# Patient Record
Sex: Female | Born: 1935 | Race: Asian | Hispanic: No | Smoking: Never smoker
Health system: Southern US, Community
[De-identification: ages and names within clinical notes are randomized; demographics above are authoritative.]

## PROBLEM LIST (undated history)

## (undated) DIAGNOSIS — F329 Major depressive disorder, single episode, unspecified: Secondary | ICD-10-CM

## (undated) DIAGNOSIS — R269 Unspecified abnormalities of gait and mobility: Secondary | ICD-10-CM

## (undated) DIAGNOSIS — E119 Type 2 diabetes mellitus without complications: Secondary | ICD-10-CM

## (undated) DIAGNOSIS — G2581 Restless legs syndrome: Secondary | ICD-10-CM

## (undated) DIAGNOSIS — I10 Essential (primary) hypertension: Secondary | ICD-10-CM

## (undated) DIAGNOSIS — F419 Anxiety disorder, unspecified: Secondary | ICD-10-CM

## (undated) DIAGNOSIS — G5710 Meralgia paresthetica, unspecified lower limb: Secondary | ICD-10-CM

## (undated) DIAGNOSIS — I679 Cerebrovascular disease, unspecified: Secondary | ICD-10-CM

## (undated) DIAGNOSIS — R1314 Dysphagia, pharyngoesophageal phase: Secondary | ICD-10-CM

## (undated) DIAGNOSIS — G4752 REM sleep behavior disorder: Secondary | ICD-10-CM

## (undated) DIAGNOSIS — G259 Extrapyramidal and movement disorder, unspecified: Secondary | ICD-10-CM

## (undated) DIAGNOSIS — F32A Depression, unspecified: Secondary | ICD-10-CM

## (undated) HISTORY — DX: Restless legs syndrome: G25.81

## (undated) HISTORY — DX: Type 2 diabetes mellitus without complications: E11.9

## (undated) HISTORY — DX: Extrapyramidal and movement disorder, unspecified: G25.9

## (undated) HISTORY — DX: Meralgia paresthetica, unspecified lower limb: G57.10

## (undated) HISTORY — DX: Dysphagia, pharyngoesophageal phase: R13.14

## (undated) HISTORY — DX: Essential (primary) hypertension: I10

## (undated) HISTORY — DX: Anxiety disorder, unspecified: F41.9

## (undated) HISTORY — PX: OTHER SURGICAL HISTORY: SHX169

## (undated) HISTORY — DX: Cerebrovascular disease, unspecified: I67.9

## (undated) HISTORY — DX: Unspecified abnormalities of gait and mobility: R26.9

## (undated) HISTORY — DX: Major depressive disorder, single episode, unspecified: F32.9

## (undated) HISTORY — DX: REM sleep behavior disorder: G47.52

## (undated) HISTORY — DX: Depression, unspecified: F32.A

---

## 2004-10-12 ENCOUNTER — Encounter: Admission: RE | Admit: 2004-10-12 | Discharge: 2004-10-12 | Payer: Self-pay | Admitting: Cardiovascular Disease

## 2006-01-09 ENCOUNTER — Inpatient Hospital Stay (HOSPITAL_COMMUNITY): Admission: EM | Admit: 2006-01-09 | Discharge: 2006-01-17 | Payer: Self-pay | Admitting: Emergency Medicine

## 2006-01-10 ENCOUNTER — Ambulatory Visit: Payer: Self-pay | Admitting: Infectious Diseases

## 2006-03-21 ENCOUNTER — Encounter: Admission: RE | Admit: 2006-03-21 | Discharge: 2006-03-21 | Payer: Self-pay | Admitting: Cardiovascular Disease

## 2007-07-18 ENCOUNTER — Inpatient Hospital Stay (HOSPITAL_COMMUNITY): Admission: AD | Admit: 2007-07-18 | Discharge: 2007-07-20 | Payer: Self-pay | Admitting: Cardiovascular Disease

## 2007-07-19 ENCOUNTER — Encounter (INDEPENDENT_AMBULATORY_CARE_PROVIDER_SITE_OTHER): Payer: Self-pay | Admitting: Gastroenterology

## 2007-07-20 ENCOUNTER — Encounter (INDEPENDENT_AMBULATORY_CARE_PROVIDER_SITE_OTHER): Payer: Self-pay | Admitting: Cardiovascular Disease

## 2010-05-25 ENCOUNTER — Other Ambulatory Visit: Payer: Self-pay | Admitting: Family Medicine

## 2010-05-25 DIAGNOSIS — G20 Parkinson's disease: Secondary | ICD-10-CM

## 2010-05-28 ENCOUNTER — Ambulatory Visit
Admission: RE | Admit: 2010-05-28 | Discharge: 2010-05-28 | Disposition: A | Discharge status: Home / Self Care | Payer: Medicare Other | Source: Ambulatory Visit | Attending: Family Medicine | Admitting: Family Medicine

## 2010-05-28 DIAGNOSIS — G20 Parkinson's disease: Secondary | ICD-10-CM

## 2010-05-28 MED ORDER — GADOBENATE DIMEGLUMINE 529 MG/ML IV SOLN: 11.0000 mL | Freq: Once | INTRAVENOUS | Status: AC | PRN

## 2010-09-01 NOTE — H&P (Signed)
[name], [name] NO.:  [id number]   MEDICAL RECORD NO.:  [id number]          PATIENT TYPE:  INP   LOCATION:  2011                         FACILITY:  [facility]   PHYSICIAN:  Ricki Rodriguez, M.D.  DATE OF BIRTH:  1929-05-04   DATE OF ADMISSION:  07/18/2007  DATE OF DISCHARGE:                              HISTORY & PHYSICAL   CHIEF COMPLAINT:  Weakness.   HISTORY OF PRESENT ILLNESS:  This 77 year old [place] female has  history of weakness for the last 1 week without any abdominal pain,  nausea, vomiting or bleeding per rectum   PAST MEDICAL HISTORY:  1. Diabetes mellitus for 2 years.  2. Hypertension for 5-10 years.  3. Hyperlipidemia.  4. Obesity.  5. Negative history of smoking, alcohol use or drug use.  6. Negative history of myocardial infarction or exercise.   PERSONAL HISTORY:  Patient is a widow; husband died in 23; he died of  overwork and starvation.   PAST SURGICAL HISTORY:  None.   MEDICATIONS:  1. Lexapro 10 mg one daily.  2. Caduet 5/10 half a tablet daily.  3. Multivitamin 1 daily.  4. Glucosamine/chondroitin sulfate 1 tablet daily.  5. Benadryl 25 mg half a tablet daily.  6. Metoprolol 50 mg half a tablet daily.  7. Calcium 600 mg daily.  8. Glimepiride 2 mg, one-fourth tablet daily.  9. Lorazepam 1 mg half a tablet daily.   ALLERGIES:  None.   FAMILY HISTORY:  Mother died of old age.  Father died of old age.  The  patient has 7 brothers; 5 of them have died of old age; 2 are living.  The patient does not have any sisters.   REVIEW OF SYSTEMS:  The patient admits to weight gain and vision change,  does not wear glasses.  No history of cataract surgery.  No history of  hearing loss.  No history of tinnitus.  Does not have any dentures.  No  asthma, COPD or pneumonia.  Positive history of exertional dyspnea.  Negative history of palpitations, dizziness, chest pain, leg edema,  claudication, nausea, vomiting, diarrhea, GI  bleed, ulcer, hiatal  hernia, hepatitis, blood transfusion, kidney stones, strokes, seizures  and psychiatric admissions.  Positive history of constipation and joint  pains.   PHYSICAL EXAMINATION:  VITAL SIGNS:  Pulse 93, respirations 20, blood  pressure 152/70, temperature 96.6, oxygen saturation 100% on room air.  The patient is 5-feet tall and weighs approximately 170 pounds.  GENERAL:  The patient is alert, oriented x2.  HEENT: The patient is normocephalic, atraumatic, has brown eyes and hazy  lens.  Pupils are equally reacting to light.  Teeth are stained.  Tongue  is pale.  Conjunctivae pale.  NECK:  No JVD, no carotid bruit.  LUNGS:  Clear bilaterally.  HEART:  Normal S1-S2 with grade 2/6 systolic murmur.  ABDOMEN:  Soft, distended, but nontender.  EXTREMITIES:  Trace edema.  CNS: Cranial nerves grossly intact.  The patient moves all 4  extremities, has bilateral equal grips, has fine tremors of the hands  and has  a shuffling gait.   LABORATORY DATA:  Revealed hemoglobin of 6.1, hematocrit of 18.3, WBC  count 8900 and platelet count 294,000.  The patient's blood group is B  positive.  Cholesterol 118, LDL cholesterol of 50, HDL cholesterol of 43  with triglyceride 127.   CMET pending.   IMPRESSION:  1. Anemia of blood loss.  2. Possible iron-deficiency anemia.  3. Obesity.  4. Hyperlipidemia.  5. Diabetes mellitus type 2.  6. Hypertension.   PLAN:  Plan is to transfuse typed and crossmatched blood, get GI consult  in the morning.      Ricki Rodriguez, M.D.  Electronically Signed     ASK/MEDQ  D:  07/18/2007  T:  07/19/2007  Job:  [id number]

## 2010-09-01 NOTE — Consult Note (Signed)
NAMEKERSTON, LANDECK NO.:  [id number]   MEDICAL RECORD NO.:  [id number]          PATIENT TYPE:  INP   LOCATION:  2011                         FACILITY:  [facility]   PHYSICIAN:  Petra Kuba, M.D.    DATE OF BIRTH:  1930-01-07   DATE OF CONSULTATION:  07/19/2007  DATE OF DISCHARGE:                                 CONSULTATION   REASON FOR CONSULTATION:  We were asked to see [name] for anemia by  Dr. Orpah Cobb.   HISTORY OF PRESENT ILLNESS:  This is a 77 year old [place] female who  is non-English speaking, __________ history was provided by Dr. Algie Coffer  and by the patient's son.  Dr. Algie Coffer tells me that the patient's  hemoglobin in his office several days ago was 6.5.  She denied any black  stool, any bright red blood per rectum.  She was admitted with fatigue  and weakness, transfused two units of packed red blood cells.  Per her  son, his mother has had difficulty sleeping and has become very fatigued  over the last three weeks.  He reports that she has had no postprandial  pain, no abdominal pain, no vomiting or other upper GI tract symptoms.  She denies that she has been taking NSAIDs.  He says she uses Tylenol  occasionally for pain; however, on my exam the patient is Guaiac  positive with black stool.   PAST MEDICAL HISTORY:  Significant for hypertension, hyperlipidemia,  Parkinson's disease, obesity, type 2 diabetes, chronic back pain,  chronic anxiety, bilateral lower extremity cellulitis.  Reportedly, she  has no history of GI bleeding before.   CURRENT MEDICATIONS:  1. Lexapro.  2. Caduet.  3. Multivitamins.  4. Glucosamine.  5. Benadryl.  6. Metoprolol.  7. Calcium.  8. Glimepiride.  9. Lorazepam.   ALLERGIES:  She has no known drug allergies.   SOCIAL HISTORY:  Social history is negative for alcohol, tobacco or  drugs.   FAMILY HISTORY:  Negative for colon cancer.  Positive for H. Pylori in  the son.   REVIEW OF SYSTEMS:  Is  positive for fatigue and insomnia   PHYSICAL EXAMINATION:  GENERAL:  She is alert and sitting up.  VITAL SIGNS:  Temperature 97.9, pulse 55, respirations are 18.  Blood  pressure is 125/80.  HEART: Has regular rate and rhythm with no murmurs, rubs or gallops  appreciated.  LUNGS:  Are clear to auscultation anteriorly.  ABDOMEN:  Obese, nontender, nondistended with good bowel sounds.  On  rectal exam she has no masses and no tenderness.  She has small amounts  of black stool that are very Guaiac positive.   LABORATORY DATA:  Labs show a post transfusion hemoglobin of 10.7,  hematocrit 31.0, white count 10.3, platelet count 275,000.  Her BUN is  17, creatinine is 0.84.  TIBC is 326.  O2 sats __________ 9.  Serum iron  is 30.  On chest x-ray she has cardiomegaly.   ASSESSMENT:  The patient has been seem and examined by Dr. Vida Rigger  who has examined the chart  and reviewed her history.  His impression is  that this is a patient with Guaiac positive, iron deficiency anemia  likely with peptic ulcer disease.  Will add Protonix and schedule her  for an upper endoscopy either late today or first thing tomorrow  morning.   Thank you very much for this consultation.      Stephani Police, PA    ______________________________  Petra Kuba, M.D.    MLY/MEDQ  D:  07/19/2007  T:  07/19/2007  Job:  [id number]

## 2010-09-01 NOTE — Op Note (Signed)
[name], RAHIMI NO.:  [id number]   MEDICAL RECORD NO.:  [id number]          PATIENT TYPE:  INP   LOCATION:  2011                         FACILITY:  [facility]   PHYSICIAN:  Petra Kuba, M.D.    DATE OF BIRTH:  June 04, 1929   DATE OF PROCEDURE:  07/19/2007  DATE OF DISCHARGE:                               OPERATIVE REPORT   PROCEDURE PERFORMED:  Esophagogastroduodenoscopy with biopsy.   INDICATION:  Guaiac positive anemia.  Consent was signed after risks,  benefits, methods, and options were thoroughly discussed by both myself  and my PA with the patient's daughter and she had discussed it with the  son before any sedation.   MEDICINES USED:  Fentanyl 50 mcg and Versed 4 mg.   PROCEDURE:  The video endoscope was inserted by direct vision.  She did  have a small hiatal hernia.  The scope was passed into the stomach and  some distal small shallow stomach ulcers were seen.  One had some black  area on it and no signs of active bleeding and was pertinent for some  mild to moderate antritis. The scope was inserted into the duodenal bulb  where a moderate size bulb ulcer was seen, it had some blackish brown  spots on it, multiple washing and suctioning was done, could not be made  to bleed.  The scope was advanced around the C-loop to a normal second  and probably third part of the duodenum except for some mild duodenitis.  No signs of bleeding or distal bleeding were seen.  The scope was slowly  withdrawn back to the bulb and, again, the ulcer seen, was washed and  watched, could not be made to bleed.  The scope was withdrawn back to  the stomach and retroflexed.  The angularis, cardia, fundus, lesser and  greater curve were evaluated on retroflex and straight visualization.  Other than some mild proximal gastritis, no additional findings were  seen. The scope was advanced to the antrum, a few biopsies of the antrum  with small erosions and ulcers as well as the  antritis were taken, and a  few of the proximal stomach were obtained to rule out Helicobacter.  Again, the scope was one more time re-advanced into the duodenum.  No  signs of bleeding were seen.  The scope was then withdrawn back to the  stomach.  Air and water were suctioned, the scope withdrawn. Again, a  good look at the esophagus was normal.  The scope was withdrawn.  The  patient tolerated the procedure well.  There was no obvious immediate  complication.   ENDOSCOPIC DIAGNOSES:  1. Small hiatal hernia.  2. Mild gastritis antritis with a few small stomach and antral ulcers      status post biopsy.  3. Medium size proximal bulb ulcer with brown and black flat spots,      unable to make bleed with washing and suctioning.  4. Otherwise, within normal limits to the third part of the duodenum      except for some mild duodenitis.  PLAN:  Await biopsies.  No aspirin or nonsteroidals. Pump inhibitors.  Consider repeat endoscopy in two months and proceed with her colonoscopy  at that junction, too, as an outpatient. However, might proceed with  colonoscopy sooner if signs of continual bleeding. Will see her back in  the office in a few weeks to decide, as long as we are able to advance  her diet and no signs of further problems in the hospital.           ______________________________  Petra Kuba, M.D.     MEM/MEDQ  D:  07/19/2007  T:  07/19/2007  Job:  [id number]   cc:   Ricki Rodriguez, M.D.

## 2010-09-04 NOTE — H&P (Signed)
[name], [name] NO.:  [id number]   MEDICAL RECORD NO.:  [id number]          PATIENT TYPE:  INP   LOCATION:  1826                         FACILITY:  [facility]   PHYSICIAN:  Ricki Rodriguez, M.D.  DATE OF BIRTH:  1930/03/19   DATE OF ADMISSION:  01/09/2006  DATE OF DISCHARGE:                                HISTORY & PHYSICAL   CHIEF COMPLAINT:  Bilateral lower extremity redness and swelling with  itching.   HISTORY OF PRESENT ILLNESS:  This 77 year old [place] Asian female has a 1-  2 week history of bilateral leg redness, swelling and itching, without  fever.  The patient was recently diagnosed with diabetes.  Has a history of  hypertension.   PAST MEDICAL HISTORY:  1. Diabetes, recent onset.  2. Hypertension for a few years.  3. No history of smoking, alcohol use or drug use.  4. Elevated cholesterol level.  5. Positive history of obesity.  (No history of myocardial infarction.  No history of exercise.)   FAMILY HISTORY:  Premature coronary artery disease.   PAST SURGICAL HISTORY:  None.   ALLERGIES:  None.   PERSONAL HISTORY:  The patient is widowed.  Her husband died in 84.  He  died of overwork and starvation.   FAMILY HISTORY:  Mother died of old age.  Father died of old age.  The  patient 7 brothers; 5 of them died of old age; 2 living.  The patient does  not have any sisters.   REVIEW OF SYSTEMS:  The patient admits to weight gain, vision change.  Does  not wear glasses.  No history of cataract surgery.  No hearing loss.  No  tinnitus.  No dentures.  No asthma or COPD or pneumonias.  Positive history  of dyspnea.  Negative history of palpitations, dizziness, chest pain, leg  edema, claudication, nausea, vomiting, diarrhea, GI bleed, ulcer, hiatal  hernia, hepatitis, blood transfusion, kidney stones, stroke, seizures,  psychiatric admissions.  Positive history of constipation and joint pains.  No history of skin rash in the past.   PHYSICAL EXAMINATION:  VITAL SIGNS:  Pulse 68, respirations p20, blood  pressure 197/97, temperature 97.2.  The patient is 5 feet tall and weighs  approximately 140 pounds.  GENERAL:  She is alert and oriented x3.  HEENT:  The patient is normocephalic and atraumatic.  Has brown eyes, hazy  lens with pupils equal and reactive to light.  Teeth are stained.  NECK:  No JVD, no carotid bruit.  LUNGS:  Clear bilaterally.  HEART:  Normal S1 and S2 with a grade 2/6 systolic murmur.  ABDOMEN:  Soft and nontender.  EXTREMITIES:  Mild tenderness, significant erythema of the right mid thigh  to half of the upper portion of the lower leg including the skin over the  knee joint is erythematous with some peeling of the skin, and left lower leg  larger, approximately 10-12 inch area of irregular-bordered rash with  redness and some peeling of the skin.  The calf area is nontender.  CNS:  Cranial nerves grossly intact.  The patient moves all 4 extremities.  Has bilateral equal grips.   LABORATORY DATA:  Normal hemoglobin and hematocrit.  Elevated WBC count of  14,900, platelet count of 280,000.  Sodium 140, potassium 4.3, chloride 105,  BUN 9, creatinine 0.9.  Glucose elevated at 181.   IMPRESSION:  1. Bilateral lower extremity cellulitis.  2. Possibilities eczematous condition with secondary skin infection.  3. Hypertension.  4. Chronic back pain.   PLAN:  The plan is to continue home medications and add IV clindamycin.  Topical Lotrisone cream.  Start Lantus and sliding scale insulin for  diabetes control. Infectious disease consult in AM.      Ricki Rodriguez, M.D.  Electronically Signed     ASK/MEDQ  D:  01/09/2006  T:  01/11/2006  Job:  [id number]

## 2010-09-04 NOTE — Discharge Summary (Signed)
[name], [name] NO.:  [id number]   MEDICAL RECORD NO.:  [id number]          PATIENT TYPE:  INP   LOCATION:  2011                         FACILITY:  [facility]   PHYSICIAN:  [name], M.D.  DATE OF BIRTH:  [birth year]   DATE OF ADMISSION:  07/18/2007  DATE OF DISCHARGE:  07/20/2007                               DISCHARGE SUMMARY   FINAL DIAGNOSES:  1. Gastroenteritis with nonspecific hemorrhage.  2. Post hemorrhagic anemia.  3. Stomach ulcer.  4. Duodenal ulcer.  5. Chronic blood loss and iron deficiency anemia.  6. Diabetes mellitus.  7. Hypertension.  8. Hyperlipidemia.  9. Obesity.  10.Paralysis agitans.  11.Back ache.  12.Anxiety.   PRINCIPAL PROCEDURE:  Esophagogastroduodenoscopy with closed biopsy by  Dr. Vida Rigger.   DISCHARGE MEDICATIONS:  1. Lexapro 5 mg 1 daily.  2. Caduet 5/20 mg 1/2 daily.  3. Multivitamin 1 daily.  4. Glucosamine/chondroitin sulphate 1 tablet daily.  5. Benadryl 50 mg 1/2 tablet in the a.m. and 1 in p.m.  6. Metoprolol 25 mg 1 in evening.  7. Calcium 600 mg plus vitamin D 400 units 1 daily in the evening.  8. Glimepiride 4 mg 1/4 tablet in the morning.  9. Lorazepam 0.5 mg 1 twice daily.  10.Protonix 40 mg 1 twice daily.   DISCHARGE DIET:  Low-sodium, heart-healthy diet.   DISCHARGE ACTIVITY:  The patient increase activity slowly.   FOLLOWUP:  By Dr. Orpah Cobb in 1-2 weeks and by Dr. Vida Rigger in 2-3  weeks.  The patient to call [phone] and [phone] respectively.   HISTORY:  This 77 year old [place] female had a history of  weakness for 1 week without any GI symptoms.  Her hemoglobin was found  to be low.   PHYSICAL EXAMINATION:  VITALS:  Pulse 93, respiration 20, blood pressure  152/70, temperature 96.6, oxygen saturation 100% on room air.  The  patient was 5 feet tall and weighed approximately 170 pounds.  GENERAL:  The patient was alert and oriented x3.  HEENT:  The patient is  normocephalic and atraumatic, has brown eyes,  wears lens.  Pupils are equal and reacting to light.  Extraocular  movement intact.  Teeth are stained. Tongue pale.  Conjunctivae pale.  NECK:  No JVD.  No carotid bruit.  LUNGS:  Clear bilaterally.  HEART:  Normal S1 and S2 with rate 2/6 systolic murmur.  ABDOMEN: Soft, distended but nontender.  EXTREMITIES:  Trace edema.  CNS:  Cranial nerves grossly intact.  The patient moves all 4  extremities, has bilateral infiltrates, and has fine tremors of the  hands and has a shuffling gait.   LABORATORY DATA:  Revealed hemoglobin 6.1, hematocrit 18, WBC count of  8900, platelets count 294,000, blood group B positive.  Cholesterol 118,  LDL cholesterol 58, HDL cholesterol 43, triglyceride of 127.  Electrolytes normal.  Glucose borderline 140, subsequent glucose 88.  BUN and creatinine normal, albumin slightly low at 3.2.  Iron low at 30.  Percent iron saturation 9.   EKG sinus rhythm with left ventricular hypertrophy.  Echocardiogram showed normal LV function with mild LVH, mild aortic  valve calcification with regurgitation, mild mitral valve regurgitation  and mild tricuspid valve regurgitation.   HOSPITAL COURSE:  The patient was admitted to telemetry unit.  He  received 2 units of blood.  This improved her hemoglobin to 10.7.  She  had a GI consult by Dr. Ewing Schlein.  Endoscopy revealed stomach and duodenal  ulcers which could be a chronic source of GI bleed.  The patient's  overall condition remained stable for next 24 to 48 hours.  Hence on  July 20, 2007, she was discharged home in satisfactory condition with  followup by me and by GI doctor in 2-4 weeks.      [name], M.D.  Electronically Signed     ASK/MEDQ  D:  08/23/2007  T:  08/24/2007  Job:  [id number]

## 2011-01-11 LAB — COMPREHENSIVE METABOLIC PANEL
AST: 24
Albumin: 3.2 — ABNORMAL LOW
Calcium: 9
Creatinine, Ser: 1
GFR calc non Af Amer: 54 — ABNORMAL LOW

## 2011-01-11 LAB — CBC
HCT: 18.3 — ABNORMAL LOW
Hemoglobin: 6.1 — CL
MCHC: 33.4
Platelets: 294
RDW: 14.3

## 2011-01-11 LAB — DIFFERENTIAL
Eosinophils Relative: 2
Lymphocytes Relative: 25
Lymphs Abs: 2.2
Monocytes Relative: 5
Neutrophils Relative %: 67

## 2011-01-11 LAB — IRON AND TIBC
Saturation Ratios: 9 — ABNORMAL LOW
UIBC: 296

## 2011-01-11 LAB — ABO/RH: ABO/RH(D): B POS

## 2011-01-11 LAB — TYPE AND SCREEN
ABO/RH(D): B POS
Antibody Screen: NEGATIVE

## 2011-01-11 LAB — LIPID PANEL
LDL Cholesterol: 50
Triglycerides: 127

## 2011-01-11 LAB — PREPARE RBC (CROSSMATCH)

## 2011-01-12 LAB — BASIC METABOLIC PANEL
BUN: 16
CO2: 24
Calcium: 9.1
Chloride: 109
Chloride: 110
Creatinine, Ser: 0.84
GFR calc Af Amer: >60
GFR calc non Af Amer: >60
Sodium: 141
Sodium: 141

## 2011-01-12 LAB — DIFFERENTIAL
Lymphocytes Relative: 26
Lymphs Abs: 2.7
Neutrophils Relative %: 63

## 2011-01-12 LAB — CBC
HCT: 31 — ABNORMAL LOW
MCHC: 34.5
MCV: 90.1
RDW: 14.6

## 2012-07-10 ENCOUNTER — Encounter: Payer: Self-pay | Admitting: Neurology

## 2012-07-10 ENCOUNTER — Ambulatory Visit (INDEPENDENT_AMBULATORY_CARE_PROVIDER_SITE_OTHER): Payer: Medicare Other | Admitting: Neurology

## 2012-07-10 VITALS — BP 98/62 | HR 60 | Ht <= 58 in | Wt 132.0 lb

## 2012-07-10 DIAGNOSIS — G5710 Meralgia paresthetica, unspecified lower limb: Secondary | ICD-10-CM | POA: Insufficient documentation

## 2012-07-10 DIAGNOSIS — G20 Parkinson's disease: Secondary | ICD-10-CM

## 2012-07-10 DIAGNOSIS — G4752 REM sleep behavior disorder: Secondary | ICD-10-CM | POA: Insufficient documentation

## 2012-07-10 DIAGNOSIS — R269 Unspecified abnormalities of gait and mobility: Secondary | ICD-10-CM

## 2012-07-10 NOTE — Progress Notes (Signed)
   Reason for visit: Parkinson's disease  [name] is an 79 y.o. female  History of present illness:  [name] is and 79 year old right-handed [place] female with a history of Parkinson's disease. The patient was increased on the Stalevo taking 4 tablets daily, but she did less well on this dose. The patient was cut back to taking 3 tablets daily, and she has function better. The patient still freezes on occasion, but she does not fall. There are no reports of memory issues or confusion or hallucinations. The patient is eating and swallowing well, she has not had any problems with choking or drooling. The patient is sleeping well at night. The patient returns for an evaluation. The family is satisfied with her functional abilities. The patient uses a quad cane for ambulation.  ROS:  Out of a complete 14 system review of symptoms, the patient complains only of the following symptoms, and all other reviewed systems are negative.  Gait disorder Tremor  Blood pressure 98/62, pulse 60, height 4' 9.5" (1.461 m), weight 132 lb (59.875 kg).  Physical Exam  General: The patient is alert and cooperative at the time of the examination. Prominent masking of the face is noted.  Skin: No significant peripheral edema is noted.   Neurologic Exam  Cranial nerves: Facial symmetry is present. Speech is normal, no aphasia or dysarthria is noted. Extraocular movements are notable for significant restriction of superior gaze. Horizontal gaze is well-maintained. Visual fields are full.  Motor: The patient has good strength in all 4 extremities.  Coordination: The patient has good finger-nose-finger and heel-to-shin bilaterally.  Gait and station: The patient has a a slow shuffling gait with decrease arm swing. The patient uses a quad cane for ambulation. Tandem gait was not attempted. Romberg is negative. No drift is seen.  Reflexes: Deep tendon reflexes are  symmetric.   Assessment/Plan:  1. Parkinson's disease  2. Gait disorder  The patient is functioning relatively well. The patient still has significant bradykinesia, but she also has mild dyskinesias on examination. The medication dosing will be kept the same. The family will contact me if there are issues in the future. The patient will otherwise followup in 5 or 6 months.  Marlan Palau MD 07/10/2012 11:48 AM

## 2012-07-10 NOTE — Patient Instructions (Addendum)
Parkinson's Disease Parkinson's disease is a disorder of the central nervous system, which includes the brain and spinal cord. A person with this disease slowly loses the ability to completely control body movements. Within the brain, there is a group of nerve cells (basal ganglia) that help control movement. The basal ganglia are damaged and do not work properly in a person with Parkinson's disease. In addition, the basal ganglia produce and use a brain chemical called dopamine. The dopamine chemical sends messages to other parts of the body to control and coordinate body movements. Dopamine levels are low in a person with Parkinson's disease. If the dopamine levels are low, then the body does not receive the correct messages it needs to move normally.  CAUSES  The exact reason why the basal ganglia get damaged is not known. Some medical researchers have thought that infection, genes, environment, and certain medicines may contribute to the cause.  SYMPTOMS   An early symptom of Parkinson's disease is often an uncontrolled shaking (tremor) of the hands. The tremor will often disappear when the affected hand is consciously used.  As the disease progresses, walking, talking, getting out of a chair, and new movements become more difficult.  Muscles get stiff and movements become slower.  Balance and coordination become harder.  Depression, trouble swallowing, urinary problems, constipation, and sleep problems can occur.  Later in the disease, memory and thought processes may deteriorate. DIAGNOSIS  There are no specific tests to diagnose Parkinson's disease. You may be referred to a neurologist for evaluation. Your caregiver will ask about your medical history, symptoms, and perform a physical exam. Blood tests and imaging tests of your brain may be performed to rule out other diseases. The imaging tests may include an MRI or a CT scan. TREATMENT  The goal of treatment is to relieve symptoms.  Medicines may be prescribed once the symptoms become troublesome. Medicine will not stop the progression of the disease, but medicine can make movement and balance better and help control tremors. Speech and occupational therapy may also be prescribed. Sometimes, surgical treatment of the brain can be done in young people. HOME CARE INSTRUCTIONS  Get regular exercise and rest periods during the day to help prevent exhaustion and depression.  If getting dressed becomes difficult, replace buttons and zippers with Velcro and elastic on your clothing.  Take all medicine as directed by your caregiver.  Install grab bars or railings in your home to prevent falls.  Go to speech or occupational therapy as directed.  Keep all follow-up visits as directed by your caregiver. SEEK MEDICAL CARE IF:  Your symptoms are not controlled with your medicine.  You fall.  You have trouble swallowing or choke on your food. MAKE SURE YOU:  Understand these instructions.  Will watch your condition.  Will get help right away if you are not doing well or get worse. Document Released: 04/02/2000 Document Revised: 10/05/2011 Document Reviewed: 05/05/2011 [employer] Patient Information 2013 [employer].  

## 2012-10-21 ENCOUNTER — Other Ambulatory Visit: Payer: Self-pay | Admitting: Neurology

## 2012-12-01 ENCOUNTER — Other Ambulatory Visit: Payer: Self-pay | Admitting: Neurology

## 2013-01-08 ENCOUNTER — Ambulatory Visit (INDEPENDENT_AMBULATORY_CARE_PROVIDER_SITE_OTHER): Payer: Medicare Other | Admitting: Neurology

## 2013-01-08 ENCOUNTER — Encounter: Payer: Self-pay | Admitting: Neurology

## 2013-01-08 VITALS — BP 113/65 | HR 73 | Wt 133.0 lb

## 2013-01-08 DIAGNOSIS — R269 Unspecified abnormalities of gait and mobility: Secondary | ICD-10-CM

## 2013-01-08 DIAGNOSIS — G20 Parkinson's disease: Secondary | ICD-10-CM

## 2013-01-08 MED ORDER — SELEGILINE HCL 5 MG PO TABS: ORAL_TABLET | ORAL | Status: DC

## 2013-01-08 NOTE — Patient Instructions (Signed)
We will continue the stalevo without change in the dosing. Start Selegiline 5 mg in the morning, and do this for 4 weeks, then go to one tablet in the morning and one tablet at noon.

## 2013-01-08 NOTE — Progress Notes (Signed)
Reason for visit: Parkinson's disease  [name] is an 79 y.o. female  History of present illness:  Ms. Gambrell is an 79 year old right-handed [place] female with a history of Parkinson's disease. The patient is on Stalevo taking the 50/200/200 mg tablet 3 times daily. The patient has some dyskinesias on this dose. The patient was unable to tolerate higher doses. The patient takes Cogentin 0.5 mg 3 times daily for tremors. The patient has had some difficulty with walking, occasionally using a cane, but generally she requires assistance for walking. The patient has not had any falls. The family indicates that she walks very little in the house. The patient has not had any problems with choking or problems with swallowing. The patient does not have confusion or hallucinations. The patient sleeps well at night. The patient will have intermittent tremors, and she is also having problems with freezing with walking. The patient returns for an evaluation. The patient requires assistance with bathing, dressing, and occasionally with feeding. The family does not report any memory issues.  Past Medical History  Diagnosis Date  . Gait disorder   . Diabetes mellitus without complication   . Hypertension   . Anxiety   . Depression   . Restless leg syndrome   . REM sleep behavior disorder   . Cerebrovascular disease   . Meralgia paresthetica     Bilateral  . Movement disorder     Parkinson's disease    History reviewed. No pertinent past surgical history.  History reviewed. No pertinent family history.  Social history:  reports that she has never smoked. She does not have any smokeless tobacco history on file. She reports that she does not drink alcohol or use illicit drugs.   No Known Allergies  Medications:  Current Outpatient Prescriptions on File Prior to Visit  Medication Sig Dispense Refill  . benztropine (COGENTIN) 0.5 MG tablet TAKE 1 TABLET BY MOUTH THREE TIMES DAILY  90 tablet   3  . carbidopa-levodopa-entacapone (STALEVO) 50-200-200 MG per tablet TAKE 1 TABLET BY MOUTH THREE TIMES DAILY  90 tablet  3  . chlorthalidone (HYGROTON) 50 MG tablet Take 50 mg by mouth daily.      . Multiple Vitamin (MULTIVITAMIN) tablet Take 1 tablet by mouth daily.       No current facility-administered medications on file prior to visit.    ROS:  Out of a complete 14 system review of symptoms, the patient complains only of the following symptoms, and all other reviewed systems are negative.  Weakness Slurred speech Gait disorder Tremors  Blood pressure 113/65, pulse 73, weight 133 lb (60.328 kg).  Physical Exam  General: The patient is alert and cooperative at the time of the examination. Masking of the face is seen.  Skin: No significant peripheral edema is noted.   Neurologic Exam  Cranial nerves: Facial symmetry is present. Speech is normal, no aphasia or dysarthria is noted. Extraocular movements are full. Visual fields are full.  Motor: The patient has good strength in all 4 extremities.  Coordination: The patient has good finger-nose-finger and heel-to-shin bilaterally. The patient has some dyskinesias involving the legs and body.  Gait and station: The patient requires assistance with walking. The patient requires assistance with standing. The patient will have freezing with initiation of walking, and with turns. Romberg is negative. No drift is seen.  Reflexes: Deep tendon reflexes are symmetric.   Assessment/Plan:  1. Parkinson's disease  2. Gait disorder  The patient is having some  problems with freezing with turns, initiating walking, and going through door frames. The patient will be placed on selegiline at this time. The patient will followup in 4 months. The Stalevo dose will be unchanged. I have encouraged the family to walk her on a regular basis.  Marlan Palau MD 01/08/2013 9:41 PM  [facility] [address] [place], [place] [address]  Phone [phone] Fax [phone]

## 2013-02-24 ENCOUNTER — Other Ambulatory Visit: Payer: Self-pay | Admitting: Neurology

## 2013-03-01 ENCOUNTER — Other Ambulatory Visit: Payer: Self-pay | Admitting: Neurology

## 2013-04-06 ENCOUNTER — Other Ambulatory Visit: Payer: Self-pay | Admitting: Neurology

## 2013-07-23 ENCOUNTER — Encounter (INDEPENDENT_AMBULATORY_CARE_PROVIDER_SITE_OTHER): Payer: Self-pay

## 2013-07-23 ENCOUNTER — Encounter: Payer: Self-pay | Admitting: Neurology

## 2013-07-23 ENCOUNTER — Ambulatory Visit (INDEPENDENT_AMBULATORY_CARE_PROVIDER_SITE_OTHER): Payer: Medicare Other | Admitting: Neurology

## 2013-07-23 VITALS — BP 155/78 | HR 72 | Ht <= 58 in | Wt 142.0 lb

## 2013-07-23 DIAGNOSIS — G20A1 Parkinson's disease without dyskinesia, without mention of fluctuations: Secondary | ICD-10-CM

## 2013-07-23 DIAGNOSIS — G20 Parkinson's disease: Secondary | ICD-10-CM

## 2013-07-23 DIAGNOSIS — R269 Unspecified abnormalities of gait and mobility: Secondary | ICD-10-CM

## 2013-07-23 MED ORDER — BENZTROPINE MESYLATE 0.5 MG PO TABS: 0.5000 mg | ORAL_TABLET | Freq: Three times a day (TID) | ORAL | Status: DC

## 2013-07-23 NOTE — Patient Instructions (Signed)

## 2013-07-23 NOTE — Progress Notes (Signed)
Reason for visit: Parkinson's disease  [name] is an 80 y.o. female  History of present illness:  [name] is an 80 year old right-handed [place] female with a history of Parkinson's disease. The patient has been on Stalevo taking the 50/200/200 mg tablet, one tablet 3 times daily. The patient was given a trial on selegiline when she was seen last, but she could not take the medication as it made her too fatigued. The patient is also taking Cogentin taking 0.5 mg tablets 3 times daily. The patient has reported tremors at times. The patient will have episodes of freezing. On occasion, she will have difficulty swallowing, particularly with liquids. This is not a big problem for the patient. The patient returns for an evaluation. The patient has family members helping her, and she needs assistance with bathing and dressing. The family reports that she does not have a significant issue with memory. The patient is relatively inactive, and she does not do much during the day.  Past Medical History  Diagnosis Date  . Gait disorder   . Diabetes mellitus without complication   . Hypertension   . Anxiety   . Depression   . Restless leg syndrome   . REM sleep behavior disorder   . Cerebrovascular disease   . Meralgia paresthetica     Bilateral  . Movement disorder     Parkinson's disease    Past Surgical History  Procedure Laterality Date  . None      History reviewed. No pertinent family history.  Social history:  reports that she has never smoked. She has never used smokeless tobacco. She reports that she does not drink alcohol or use illicit drugs.   No Known Allergies  Medications:  Current Outpatient Prescriptions on File Prior to Visit  Medication Sig Dispense Refill  . carbidopa-levodopa-entacapone (STALEVO) 50-200-200 MG per tablet TAKE 1 TABLET BY MOUTH THREE TIMES DAILY  90 tablet  3  . chlorthalidone (HYGROTON) 50 MG tablet Take 50 mg by mouth daily.      .  Multiple Vitamin (MULTIVITAMIN) tablet Take 1 tablet by mouth daily.       No current facility-administered medications on file prior to visit.    ROS:  Out of a complete 14 system review of symptoms, the patient complains only of the following symptoms, and all other reviewed systems are negative.  Daytime sleepiness Gait disturbance Tremors  Blood pressure 155/78, pulse 72, height 4\' 9"  (1.448 m), weight 142 lb (64.411 kg).  Physical Exam  General: The patient is alert and cooperative at the time of the examination.  Skin: No significant peripheral edema is noted.   Neurologic Exam  Mental status: The patient is oriented x 3.  Cranial nerves: Facial symmetry is present. Speech is normal, no aphasia or dysarthria is noted. Extraocular movements are full. Visual fields are full, with exception that there is a slight restriction of superior gaze.. Masking of the face is seen.  Motor: The patient has good strength in all 4 extremities.  Sensory examination: Soft sensation is symmetric on the face, arms, and legs.  Coordination: The patient has good finger-nose-finger and heel-to-shin bilaterally. The dyskinesias are noted with the left leg primarily  Gait and station: The patient is able to walk independently, decreased arm swing is seen. The patient had slowness with turns. The patient does have a quad cane for ambulation. Tandem gait was not attempted. Romberg is negative. No drift is seen.  Reflexes: Deep tendon reflexes  are symmetric.   Assessment/Plan:  One. Parkinson's disease  2. Gait disturbance  The patient currently is ambulating fairly well independently, but she does have a quad cane for ambulation. The patient is relatively inactive, and I have encouraged the patient to exercise on a regular basis. The patient will be kept on her current medications with the Cogentin and the Stalevo. The patient will followup in 4 or 5 months.  Marlan Palau MD 07/23/2013  8:58 PM  [facility] [address] [place], [place] [address]  Phone [phone] Fax [phone]

## 2013-08-02 ENCOUNTER — Other Ambulatory Visit: Payer: Self-pay | Admitting: Neurology

## 2013-08-17 ENCOUNTER — Other Ambulatory Visit: Payer: Self-pay | Admitting: Neurology

## 2014-01-21 ENCOUNTER — Ambulatory Visit: Payer: Medicare Other | Admitting: Neurology

## 2014-03-05 ENCOUNTER — Other Ambulatory Visit: Payer: Self-pay | Admitting: Neurology

## 2014-03-06 ENCOUNTER — Encounter: Payer: Self-pay | Admitting: Neurology

## 2014-03-08 ENCOUNTER — Other Ambulatory Visit: Payer: Self-pay | Admitting: Neurology

## 2014-03-11 ENCOUNTER — Encounter: Payer: Self-pay | Admitting: Neurology

## 2014-03-11 ENCOUNTER — Ambulatory Visit (INDEPENDENT_AMBULATORY_CARE_PROVIDER_SITE_OTHER): Payer: Medicare Other | Admitting: Neurology

## 2014-03-11 VITALS — BP 139/71 | HR 64

## 2014-03-11 DIAGNOSIS — G20 Parkinson's disease: Secondary | ICD-10-CM

## 2014-03-11 DIAGNOSIS — R269 Unspecified abnormalities of gait and mobility: Secondary | ICD-10-CM

## 2014-03-11 NOTE — Progress Notes (Signed)
Reason for visit: Parkinson's disease  [name] is an 80 y.o. female  History of present illness:  [name] is an 80 year old right-handed [place] female with a history of Parkinson's disease. The patient is relatively inactive, not doing much walking during the day. The patient uses a quad cane for ambulation, and she is able to walk short distances with this. The patient is having increasing problems with swallowing liquids, and she will choke with this. The patient indicates that using a straw will help the swallowing. The patient is also sleeping quite a bit during the day. The patient sleeps well at night, but she does act out her dreams, and she does snore. The patient remains on the Stalevo taking 3 tablets of the 50/200/200 mg tablets daily. The patient is on Cogentin taking 0.5 mg 3 times daily. She returns this office for an evaluation.  Past Medical History  Diagnosis Date  . Gait disorder   . Diabetes mellitus without complication   . Hypertension   . Anxiety   . Depression   . Restless leg syndrome   . REM sleep behavior disorder   . Cerebrovascular disease   . Meralgia paresthetica     Bilateral  . Movement disorder     Parkinson's disease    Past Surgical History  Procedure Laterality Date  . None      History reviewed. No pertinent family history.  Social history:  reports that she has never smoked. She has never used smokeless tobacco. She reports that she does not drink alcohol or use illicit drugs.   No Known Allergies  Medications:  Current Outpatient Prescriptions on File Prior to Visit  Medication Sig Dispense Refill  . benztropine (COGENTIN) 0.5 MG tablet Take 1 tablet (0.5 mg total) by mouth 3 (three) times daily.    . carbidopa-levodopa-entacapone (STALEVO) 50-200-200 MG per tablet TAKE 1 TABLET 3 TIMES A DAY 90 tablet 0  . chlorthalidone (HYGROTON) 50 MG tablet Take 50 mg by mouth daily.    . Multiple Vitamin (MULTIVITAMIN) tablet Take 1  tablet by mouth daily.     No current facility-administered medications on file prior to visit.    ROS:  Out of a complete 14 system review of symptoms, the patient complains only of the following symptoms, and all other reviewed systems are negative.  Fatigue Difficulty swallowing Blurred vision Constipation Daytime sleepiness Walking difficulty Weakness Anxiety  Blood pressure 139/71, pulse 64, height 0' (0 m), weight 0 lb (0 kg).  Physical Exam  General: The patient is alert and cooperative at the time of the examination.  Skin: No significant peripheral edema is noted.   Neurologic Exam  Mental status: The patient is oriented x 3.  Cranial nerves: Facial symmetry is present. Speech is normal, no aphasia or dysarthria is noted. Extraocular movements are full, with except that there is some restriction with superior gaze. Visual fields are full.  Motor: The patient has good strength in all 4 extremities.  Sensory examination: Soft touch sensation is symmetric on the face, arms, and legs.  Coordination: The patient has good finger-nose-finger and heel-to-shin bilaterally. Dyskinesias involving the body and the right leg were seen.  Gait and station: The patient has a short shuffling gait. Tandem gait was not attempted. The patient uses a quad cane for ambulation.. Romberg is negative. No drift is seen.  Reflexes: Deep tendon reflexes are symmetric.   Assessment/Plan:  1. Parkinson's disease  2. Excessive daytime drowsiness  3.  REM sleep disorder  The patient is having increasing problems with excessive daytime drowsiness. I have recommended a sleep study, but the family wishes to follow this issue over time for now. The patient is having some dysphagia with liquids. If this worsens, a modified barium swallow can be done. She will follow-up in 4 or 5 months for an evaluation.  Marlan Palau. Keith Willis MD 03/11/2014 11:26 AM  [facility] [address] [place], [place] [address]  Phone [phone] Fax [phone]

## 2014-03-11 NOTE — Patient Instructions (Signed)
Parkinson Disease Parkinson disease is a disorder of the central nervous system, which includes the brain and spinal cord. A person with this disease slowly loses the ability to completely control body movements. Within the brain, there is a group of nerve cells (basal ganglia) that help control movement. The basal ganglia are damaged and do not work properly in a person with Parkinson disease. In addition, the basal ganglia produce and use a brain chemical called dopamine. The dopamine chemical sends messages to other parts of the body to control and coordinate body movements. Dopamine levels are low in a person with Parkinson disease. If the dopamine levels are low, then the body does not receive the correct messages it needs to move normally.  CAUSES  The exact reason why the basal ganglia get damaged is not known. Some medical researchers have thought that infection, genes, environment, and certain medicines may contribute to the cause.  SYMPTOMS   An early symptom of Parkinson disease is often an uncontrolled shaking (tremor) of the hands. The tremor will often disappear when the affected hand is consciously used.  As the disease progresses, walking, talking, getting out of a chair, and new movements become more difficult.  Muscles get stiff and movements become slower.  Balance and coordination become harder.  Depression, trouble swallowing, urinary problems, constipation, and sleep problems can occur.  Later in the disease, memory and thought processes may deteriorate. DIAGNOSIS  There are no specific tests to diagnose Parkinson disease. You may be referred to a neurologist for evaluation. Your caregiver will ask about your medical history, symptoms, and perform a physical exam. Blood tests and imaging tests of your brain may be performed to rule out other diseases. The imaging tests may include an MRI or a CT scan. TREATMENT  The goal of treatment is to relieve symptoms. Medicines may be  prescribed once the symptoms become troublesome. Medicine will not stop the progression of the disease, but medicine can make movement and balance better and help control tremors. Speech and occupational therapy may also be prescribed. Sometimes, surgical treatment of the brain can be done in young people. HOME CARE INSTRUCTIONS  Get regular exercise and rest periods during the day to help prevent exhaustion and depression.  If getting dressed becomes difficult, replace buttons and zippers with Velcro and elastic on your clothing.  Take all medicine as directed by your caregiver.  Install grab bars or railings in your home to prevent falls.  Go to speech or occupational therapy as directed.  Keep all follow-up visits as directed by your caregiver. SEEK MEDICAL CARE IF:  Your symptoms are not controlled with your medicine.  You fall.  You have trouble swallowing or choke on your food. MAKE SURE YOU:  Understand these instructions.  Will watch your condition.  Will get help right away if you are not doing well or get worse. Document Released: 04/02/2000 Document Revised: 07/31/2012 Document Reviewed: 05/05/2011 Exit[employer] Patient Information 2015 [employer]. This information is not intended to replace advice given to you by your health care provider. Make sure you discuss any questions you have with your health care provider.  

## 2014-03-12 ENCOUNTER — Encounter: Payer: Self-pay | Admitting: Neurology

## 2014-04-03 ENCOUNTER — Other Ambulatory Visit: Payer: Self-pay | Admitting: Neurology

## 2014-04-07 ENCOUNTER — Other Ambulatory Visit: Payer: Self-pay | Admitting: Neurology

## 2014-06-20 ENCOUNTER — Other Ambulatory Visit: Payer: Self-pay | Admitting: Neurology

## 2014-06-20 MED ORDER — BENZTROPINE MESYLATE 0.5 MG PO TABS: 0.5000 mg | ORAL_TABLET | Freq: Three times a day (TID) | ORAL | Status: DC

## 2014-07-29 ENCOUNTER — Ambulatory Visit: Payer: Medicare Other | Admitting: Neurology

## 2014-08-05 ENCOUNTER — Ambulatory Visit: Payer: Medicare Other | Admitting: Neurology

## 2014-08-12 ENCOUNTER — Encounter: Payer: Self-pay | Admitting: Neurology

## 2014-08-12 ENCOUNTER — Ambulatory Visit (INDEPENDENT_AMBULATORY_CARE_PROVIDER_SITE_OTHER): Payer: Medicare Other | Admitting: Neurology

## 2014-08-12 VITALS — BP 144/61 | HR 59 | Ht <= 58 in | Wt 137.8 lb

## 2014-08-12 DIAGNOSIS — G20 Parkinson's disease: Secondary | ICD-10-CM

## 2014-08-12 DIAGNOSIS — R269 Unspecified abnormalities of gait and mobility: Secondary | ICD-10-CM | POA: Diagnosis not present

## 2014-08-12 DIAGNOSIS — R1314 Dysphagia, pharyngoesophageal phase: Secondary | ICD-10-CM | POA: Insufficient documentation

## 2014-08-12 DIAGNOSIS — G4752 REM sleep behavior disorder: Secondary | ICD-10-CM | POA: Diagnosis not present

## 2014-08-12 HISTORY — DX: Dysphagia, pharyngoesophageal phase: R13.14

## 2014-08-12 NOTE — Patient Instructions (Signed)

## 2014-08-12 NOTE — Progress Notes (Signed)
Reason for visit: Parkinson's disease  [name] is an 81 y.o. female  History of present illness:  [name] is an 81 year old right-handed Asian female with a history of Parkinson's disease. The patient has had worsening problems with her walking. The patient is unable to ambulate at times. She is having some problems with urinary incontinence, and she wears adult diapers at this point. She is in general very inactive, and never has been. She does not like to walk even when her family tries to encourage her to become active. She is having increasing problems with choking with swallowing liquids. She is having increasing problems with excessive daytime drowsiness. She will snore at night, she will act out her dreams, and talk in her sleep. The patient may have hallucinations at times. The family indicates that her memory is good, however. The patient walks with a quad cane, she has fallen on occasion. Overall, her oxygen level has declined since last seen.  Past Medical History  Diagnosis Date  . Gait disorder   . Diabetes mellitus without complication   . Hypertension   . Anxiety   . Depression   . Restless leg syndrome   . REM sleep behavior disorder   . Cerebrovascular disease   . Meralgia paresthetica     Bilateral  . Movement disorder     Parkinson's disease  . Dysphagia, pharyngoesophageal phase 08/12/2014    Dysphagia for liquids    Past Surgical History  Procedure Laterality Date  . None      History reviewed. No pertinent family history.  Social history:  reports that she has never smoked. She has never used smokeless tobacco. She reports that she does not drink alcohol or use illicit drugs.   No Known Allergies  Medications:  Prior to Admission medications   Medication Sig Start Date End Date Taking? Authorizing Provider  benztropine (COGENTIN) 0.5 MG tablet Take 1 tablet (0.5 mg total) by mouth 3 (three) times daily. 06/20/14  Yes York Spaniel, MD    carbidopa-levodopa-entacapone (STALEVO) 50-200-200 MG per tablet TAKE 1 TABLET BY MOUTH THREE TIMES DAILY 04/03/14  Yes York Spaniel, MD  chlorthalidone (HYGROTON) 50 MG tablet Take 50 mg by mouth daily.   Yes Historical Provider, MD  Multiple Vitamin (MULTIVITAMIN) tablet Take 1 tablet by mouth daily.   Yes Historical Provider, MD    ROS:  Out of a complete 14 system review of symptoms, the patient complains only of the following symptoms, and all other reviewed systems are negative.  Decreased appetite, fatigue Difficulty swallowing Eye itching, blurred vision Choking Constipation Daytime drowsiness Walking difficulty Speech difficulty, weakness, tremors  Blood pressure 144/61, pulse 59, height  (1.448 m), weight 137 lb 12.8 oz (62.506 kg).  Physical Exam  General: The patient is alert and cooperative at the time of the examination.  Skin: No significant peripheral edema is noted.   Neurologic Exam  Mental status: The patient is alert and oriented x 3 at the time of the examination. The patient has apparent normal recent and remote memory, with an apparently normal attention span and concentration ability.   Cranial nerves: Facial symmetry is present. Speech is normal, no aphasia or dysarthria is noted. Extraocular movements are full. Visual fields are full. Masking of the face is seen.  Motor: The patient has good strength in all 4 extremities. The patient has dyskinesias affecting mainly the left leg.  Sensory examination: Soft touch sensation is symmetric on the  face, arms, and legs.  Coordination: The patient has good finger-nose-finger and heel-to-shin bilaterally.  Gait and station: The patient is able to stand with some assistance. Once up, she can ambulate short distances with a quad cane. Tandem gait was not attempted.  Reflexes: Deep tendon reflexes are symmetric.   Assessment/Plan:  1. Parkinson's disease  2. Gait disorder  3. Dysphagia for  liquids  4. Excessive daytime drowsiness, probable REM sleep disorder  The patient will be maintained on her current Stalevo dose. The benztropine will be is continued. The family believes that this was not effective. The patient will be sent for a modified barium swallow, and she will be sent for sleep evaluation given the excessive drowsiness during the day. This appears to have limited her ability to perform any activity during the day, but the patient by nature is very inactive. She will follow-up in about 4 months.  Marlan Palau. Keith Willis MD 08/12/2014 8:07 PM  [facility] [address] [place], [place] [address]  Phone [phone] Fax [phone]

## 2014-08-28 ENCOUNTER — Ambulatory Visit (INDEPENDENT_AMBULATORY_CARE_PROVIDER_SITE_OTHER): Payer: Medicare Other | Admitting: Neurology

## 2014-08-28 ENCOUNTER — Encounter: Payer: Self-pay | Admitting: Neurology

## 2014-08-28 VITALS — BP 132/70 | HR 62 | Resp 14

## 2014-08-28 DIAGNOSIS — E669 Obesity, unspecified: Secondary | ICD-10-CM | POA: Diagnosis not present

## 2014-08-28 DIAGNOSIS — G4752 REM sleep behavior disorder: Secondary | ICD-10-CM

## 2014-08-28 DIAGNOSIS — G4719 Other hypersomnia: Secondary | ICD-10-CM | POA: Diagnosis not present

## 2014-08-28 DIAGNOSIS — G20 Parkinson's disease: Secondary | ICD-10-CM

## 2014-08-28 DIAGNOSIS — R0683 Snoring: Secondary | ICD-10-CM | POA: Diagnosis not present

## 2014-08-28 DIAGNOSIS — R351 Nocturia: Secondary | ICD-10-CM | POA: Diagnosis not present

## 2014-08-28 NOTE — Progress Notes (Signed)
Subjective:    Patient ID: [name] is a 81 y.o. female.  HPI     Huston FoleySaima Dominyck Reser, MD, PhD [facility] [address], [address] [address] [place], [place] [id number]  Dear Mellody DanceKeith,   I saw your patient, [name], upon your kind request in my clinic today for initial consultation of her sleep disorder, in particular, concern for possible obstructive sleep apnea and REM behavior disorder. The patient is accompanied by her daughter, [name], today, who helps with English translation as the patient is non-English-speaking. As you know, [name] is a very pleasant 81 year old [place] lady with an underlying medical history of Parkinson's disease, hypertension, diabetes, anxiety, depression, restless leg syndrome, and obesity who is reported to have excessive daytime sleepiness, snoring, and acting out of dreams as well as sleep talking. For the past 3 years she has been sleeping reasonably well at night according to her daughter. Prior to that she was very restless in her sleep at night. Nevertheless, in the past year, she has been very sleepy during the day and sleeps several hours during the day and has to be woken up at times to eat. She is mostly sedentary during the day. She hardly walks. She needs assistance with a walker. She has fallen. She has occasional dream enactments at this time. She rarely sleep talks now. She snores and makes gasping noises and strangling sounds in her sleep. She has at times had trouble swallowing. She has a total of 11 children. 6 take turns in taking care of her and she stays with one of them for a few days at a time. She has to get up and use the bathroom to 3 times on an average night. This is also disruptive to her sleep.  Her Past Medical History Is Significant For: Past Medical History  Diagnosis Date  . Gait disorder   . Diabetes mellitus without complication   . Hypertension   . Anxiety   . Depression   . Restless leg syndrome    . REM sleep behavior disorder   . Cerebrovascular disease   . Meralgia paresthetica     Bilateral  . Movement disorder     Parkinson's disease  . Dysphagia, pharyngoesophageal phase 08/12/2014    Dysphagia for liquids    Her Past Surgical History Is Significant For: Past Surgical History  Procedure Laterality Date  . None      Her Family History Is Significant For: No family history on file.  Her Social History Is Significant For: History   Social History  . Marital Status: Divorced    Spouse Name: N/A  . Number of Children: 11  . Years of Education: 4 th   Occupational History  . N/A     retired   Social History Main Topics  . Smoking status: Never Smoker   . Smokeless tobacco: Never Used  . Alcohol Use: No  . Drug Use: No  . Sexual Activity: Not on file   Other Topics Concern  . None   Social History Narrative   Patient is widowed . Patient lives with her daughter [name] Halon(Thuy Tran) [phone].   Retired.   Education 4 th grade.   Right handed.   Caffeine - 3-4 cups of tea daily    Her Allergies Are:  No Known Allergies:   Her Current Medications Are:  Outpatient Encounter Prescriptions as of 08/28/2014  Medication Sig  . carbidopa-levodopa-entacapone (STALEVO) 50-200-200 MG per tablet TAKE 1  TABLET BY MOUTH THREE TIMES DAILY  . chlorthalidone (HYGROTON) 50 MG tablet Take 50 mg by mouth daily.  . Multiple Vitamin (MULTIVITAMIN) tablet Take 1 tablet by mouth daily.   No facility-administered encounter medications on file as of 08/28/2014.  :  Review of Systems:  Out of a complete 14 point review of systems, all are reviewed and negative with the exception of these symptoms as listed below:   Review of Systems  Constitutional: Positive for fatigue.  HENT: Positive for trouble swallowing.   Respiratory:       Snore   Neurological:       Talking and snoring during sleep, confusion, slurred speech, Tremor, daytime sleepiness  Psychiatric/Behavioral:        Too much sleep, change in appetite    Objective:  Neurologic Exam  Physical Exam Physical Examination:   Filed Vitals:   08/28/14 0933  BP: 132/70  Pulse: 62  Resp: 14    General Examination: The patient is a very pleasant 81 y.o. female in no acute distress. She appears well-developed and well-nourished and well groomed. She is situated in a wheelchair. She did not bring a cane or walker today. She is quiet. She is answering questions and following commands.  HEENT: Normocephalic, atraumatic, pupils are equal, round and reactive to light and accommodation. Funduscopic exam is difficult. Extra ocular tracking shows saccadic breakdown. She has mild facial masking. She has mild hypophonia but no dysarthria noted from what I can tell. She has no carotid bruits. She has on oropharynx exam moderate mouth dryness, and thicker tongue and moderately tight appearing airway secondary to larger tongue, narrow airway entry and thicker soft palate. She has a sensitive gag and I could not see her uvula completely. Tonsils were removed according to the daughter.  neck is mildly rigid.   Chest: Clear to auscultation without wheezing, rhonchi or crackles noted.  Heart: S1+S2+0, regular and normal without murmurs, rubs or gallops noted.   Abdomen: Soft, non-tender and non-distended with normal bowel sounds appreciated on auscultation.  Extremities: There is trace pitting edema in the distal lower extremities bilaterally. Pedal pulses are intact.  Skin: Warm and dry without trophic changes noted. There are no varicose veins.  Musculoskeletal: exam reveals no obvious joint deformities, tenderness or joint swelling or erythema.  she has a healing scar over the right shin. Her daughter states that she scraped herself a few weeks ago when she fell.   Neurologically:  Mental status: The patient is awake, alert and oriented in all 4 spheres. Her immediate and remote memory, attention, language skills  and fund of knowledge seem appropriate but are difficult to gauge. Mood is normal and affect is blunted.  Cranial nerves II - XII are as described above under HEENT exam. In addition: shoulder shrug is normal with equal shoulder height noted. Motor exam: Normal bulk, strength is noted for age, tone is increased, right more than left. She has mild dyskinesias in the left lower extremity, intermittently. She has symmetrical reflexes. Fine motor skills are moderately impaired throughout, right-sided perhaps a little worse than left. She is unable to stand for me.   Assessment and Plan:   In summary, [name] is a very pleasant 81 y.o.-year old female with an underlying medical history of Parkinson's disease, hypertension, diabetes, anxiety, depression, restless leg syndrome, and obesity who is reported to have excessive daytime sleepiness, snoring, and acting out of dreams as well as sleep talking. Her history and physical exam are  concerning for underlying obstructive sleep apnea. In addition, she has a history of REM behavior disorder. I had a long chat with the patient's daughter about these diagnoses, I explained that we will look for sleep disorder contributing to her daytime somnolence. As you know, most patients with Parkinson's disease have sleep fragmentation and achieved little sleep consolidation and little deep sleep or dream sleep at times. She may have multiple reasons to be sleepy during the day including advancing age, medication effect, fatigue secondary to her Parkinson's disease, and of course an organic underlying sleep disorder including sleep disordered breathing and/or REM behavior disorder. We will be on the look out for obstructive sleep apnea type changes and changes in keeping with REM behavior disorder. I explained the diagnosis of OSA to the daughter. She is advised, that it will be helpful if one of them can stay with her for the overnight sleep test and her daughters agreeable.  She would like to schedule her mother sleep study in August when she has time to stay with her overnight. I think we can go ahead and schedule her out into August and we can always move her up sooner if there is another possibility for her to stay with one of her other children for sleep test. I will see her back after the sleep study is completed. I explained to the daughter the risks and ramifications of untreated moderate to severe OSA, especially with respect to developing cardiovascular disease down the Road, including congestive heart failure, difficult to treat hypertension, cardiac arrhythmias, or stroke. Even type 2 diabetes has, in part, been linked to untreated OSA. Symptoms of untreated OSA include daytime sleepiness, memory problems, mood irritability and mood disorder such as depression and anxiety, lack of energy, as well as recurrent headaches, especially morning headaches.  I recommended the following at this time: sleep study with potential positive airway pressure titration. (We will score hypopneas at 4 and split the sleep study into diagnostic and treatment portion, if the estimated. 2 hour AHI is >15).   Thank you very much for allowing me to participate in the care of this nice patient. If I can be of any further assistance to you please do not hesitate to talk to me.   Sincerely,   Huston FoleySaima Arali Somera, MD, PhD

## 2014-08-28 NOTE — Patient Instructions (Signed)

## 2014-09-11 ENCOUNTER — Other Ambulatory Visit (HOSPITAL_COMMUNITY): Payer: Self-pay | Admitting: Neurology

## 2014-09-11 DIAGNOSIS — R1314 Dysphagia, pharyngoesophageal phase: Secondary | ICD-10-CM

## 2014-09-18 ENCOUNTER — Ambulatory Visit (INDEPENDENT_AMBULATORY_CARE_PROVIDER_SITE_OTHER): Payer: Medicare Other | Admitting: Neurology

## 2014-09-18 VITALS — BP 151/79 | HR 63

## 2014-09-18 DIAGNOSIS — G479 Sleep disorder, unspecified: Secondary | ICD-10-CM

## 2014-09-18 DIAGNOSIS — G4720 Circadian rhythm sleep disorder, unspecified type: Secondary | ICD-10-CM

## 2014-09-18 DIAGNOSIS — R0683 Snoring: Secondary | ICD-10-CM

## 2014-09-18 DIAGNOSIS — G478 Other sleep disorders: Secondary | ICD-10-CM | POA: Diagnosis not present

## 2014-09-18 DIAGNOSIS — G4761 Periodic limb movement disorder: Secondary | ICD-10-CM

## 2014-09-18 NOTE — Sleep Study (Signed)
Please see the scanned sleep study interpretation located in the Procedure tab within the Chart Review section. 

## 2014-09-24 ENCOUNTER — Telehealth: Payer: Self-pay | Admitting: Neurology

## 2014-09-24 NOTE — Telephone Encounter (Signed)
Dr. Anne HahnWillis' patient (not English speaking), I saw her on 08/28/14.  Please call and notify the patient's daughter that the recent sleep study did not show any significant obstructive sleep apnea. Please inform her that it will be best if we go over the details of the study during a follow up appointment. Please arrange a followup appointment. Also, route or fax report to PCP and referring MD, if other than PCP.  Once you have spoken to patient, you can close this encounter.   Thanks,  Huston FoleySaima Sebastion Jun, MD, PhD [facility]

## 2014-09-24 NOTE — Telephone Encounter (Signed)
I spoke to son. He is aware of results and made appt for this Thursday at 11am .

## 2014-09-24 NOTE — Telephone Encounter (Signed)
Faxed to PCP

## 2014-09-26 ENCOUNTER — Encounter: Payer: Self-pay | Admitting: Neurology

## 2014-09-26 ENCOUNTER — Ambulatory Visit (INDEPENDENT_AMBULATORY_CARE_PROVIDER_SITE_OTHER): Payer: Medicare Other | Admitting: Neurology

## 2014-09-26 VITALS — BP 112/70 | HR 68 | Resp 12

## 2014-09-26 DIAGNOSIS — G20 Parkinson's disease: Secondary | ICD-10-CM

## 2014-09-26 DIAGNOSIS — G4719 Other hypersomnia: Secondary | ICD-10-CM | POA: Diagnosis not present

## 2014-09-26 DIAGNOSIS — G4761 Periodic limb movement disorder: Secondary | ICD-10-CM | POA: Diagnosis not present

## 2014-09-26 DIAGNOSIS — G4752 REM sleep behavior disorder: Secondary | ICD-10-CM | POA: Diagnosis not present

## 2014-09-26 NOTE — Progress Notes (Signed)
Subjective:    Patient ID: [name] is a 81 y.o. female.  HPI     Interim history:   [name] is an 81 year old right-handed woman with an underlying medical history of Parkinson's disease, hypertension, diabetes, anxiety, depression, restless leg syndrome and obesity, who presents for follow-up consultation after her recent sleep study. The patient is accompanied by her daughter, [name], again today, who helps with English translation as the patient is non-English-speaking. I first met her on 08/28/2014 at the request of Dr. Jannifer Franklin, at which time the daughter reported a history of excessive sleepiness and snoring and acting out and dreams as well as sleep talking. I invited her back for sleep study. She had a baseline sleep study on 09/18/2014 and underwent over her test results with him in detail today. Her sleep efficiency was reduced at 82.8% with a latency to sleep of 16 minutes and wake after sleep onset of 55 minutes with mild sleep fragmentation noted. She had a borderline arousal index of 10.3 per hour. She had a markedly increased percentage of stage II sleep at 92.4% and absence of slow-wave sleep and REM sleep. She had mild PLMS with an index of 17.5 per hour, resulting in only 1.5 arousals per hour. She had rare PVCs on EKG. She had very mild snoring. Total AHI was 2.6 per hour. Average oxygen saturation was 97%, nadir was 80%. Time below 90% saturation was about 5 minutes.  Today, 09/26/2014: Her daughter provides the history. The patient is nonverbal today.   her daughter reports no new issues. They are worried about her constant involuntary movements. The patient spends her day in the wheelchair. She can use the walker with assistance only. Sometimes they need 2 people assist. She usually just uses the walker when she has to get up to move from one place to another as necessary such as going to the bathroom. Most of the time she will stay in the wheelchair. As far as her sleep, she  continues to have some yelling out and at times some laughing in her sleep but no major movements, and fact she stays in one spot most of the time. 5 of her children take care of her and she stays with them alternating. She has a total of 11 children. Appetite seems adequate. They try to push for water intake. She is still quite sleepy during the day and falls asleep easily, taking multiple shorter naps throughout the day.  Previously:   She is reported to have excessive daytime sleepiness, snoring, and acting out of dreams as well as sleep talking. For the past 3 years she has been sleeping reasonably well at night according to her daughter. Prior to that she was very restless in her sleep at night. Nevertheless, in the past year, she has been very sleepy during the day and sleeps several hours during the day and has to be woken up at times to eat. She is mostly sedentary during the day. She hardly walks. She needs assistance with a walker. She has fallen. She has occasional dream enactments at this time. She rarely sleep talks now. She snores and makes gasping noises and strangling sounds in her sleep. She has at times had trouble swallowing. She has a total of 11 children. 6 take turns in taking care of her and she stays with one of them for a few days at a time. She has to get up and use the bathroom to 3 times on an average night.  This is also disruptive to her sleep.  Her Past Medical History Is Significant For: Past Medical History  Diagnosis Date  . Gait disorder   . Diabetes mellitus without complication   . Hypertension   . Anxiety   . Depression   . Restless leg syndrome   . REM sleep behavior disorder   . Cerebrovascular disease   . Meralgia paresthetica     Bilateral  . Movement disorder     Parkinson's disease  . Dysphagia, pharyngoesophageal phase 08/12/2014    Dysphagia for liquids    Her Past Surgical History Is Significant For: Past Surgical History  Procedure Laterality  Date  . None      Her Family History Is Significant For: No family history on file.  Her Social History Is Significant For: History   Social History  . Marital Status: Divorced    Spouse Name: N/A  . Number of Children: 11  . Years of Education: 4 th   Occupational History  . N/A     retired   Social History Main Topics  . Smoking status: Never Smoker   . Smokeless tobacco: Never Used  . Alcohol Use: No  . Drug Use: No  . Sexual Activity: Not on file   Other Topics Concern  . None   Social History Narrative   Patient is widowed . Patient lives with her daughter [name]) [phone].   Retired.   Education 4 th grade.   Right handed.   Caffeine - 3-4 cups of tea daily    Her Allergies Are:  No Known Allergies:   Her Current Medications Are:  Outpatient Encounter Prescriptions as of 09/26/2014  Medication Sig  . carbidopa-levodopa-entacapone (STALEVO) 50-200-200 MG per tablet TAKE 1 TABLET BY MOUTH THREE TIMES DAILY  . chlorthalidone (HYGROTON) 50 MG tablet Take 50 mg by mouth daily.  . Multiple Vitamin (MULTIVITAMIN) tablet Take 1 tablet by mouth daily.   No facility-administered encounter medications on file as of 09/26/2014.  :  Review of Systems:  Out of a complete 14 point review of systems, all are reviewed and negative with the exception of these symptoms as listed below:   Review of Systems  Constitutional: Positive for fatigue.  All other systems reviewed and are negative.   Objective:  Neurologic Exam  Physical Exam Physical Examination:   Filed Vitals:   09/26/14 1102  BP: 112/70  Pulse: 68  Resp: 12   General Examination: The patient is a very pleasant 81 y.o. female in no acute distress. She appears well-developed and well-nourished and well groomed. She is situated in a wheelchair. She is not speaking today. She is following commands by mimicking.  HEENT: Normocephalic, atraumatic, pupils are equal, round and reactive to light  and accommodation. Funduscopic exam is difficult. Extra ocular tracking shows saccadic breakdown. She has mild facial masking. She has on oropharynx exam mild mouth dryness. Neck is mildly rigid.   Chest: Clear to auscultation without wheezing, rhonchi or crackles noted.  Heart: S1+S2+0, regular and normal without murmurs, rubs or gallops noted.   Abdomen: Soft, non-tender and non-distended with normal bowel sounds appreciated on auscultation.  Extremities: There is trace pitting edema in the distal lower extremities bilaterally. Pedal pulses are intact.  Skin: Warm and dry without trophic changes noted. There are no varicose veins.  Musculoskeletal: exam reveals no obvious joint deformities, tenderness or joint swelling or erythema.   Neurologically:  Mental status: The patient is awake, alert..  Cranial  nerves II - XII are as described above under HEENT exam. In addition: shoulder shrug is normal with equal shoulder height noted. Motor exam: Normal bulk, strength is noted for age, tone is increased, right more than left. She has mild dyskinesias in the left lower extremity, continually. She has symmetrical reflexes. Fine motor skills are moderately impaired throughout, right-sided perhaps a little worse than left. She is unable to stand for me.   Assessment and Plan:   In summary, [name] is a very pleasant 81 year old female with an underlying medical history of Parkinson's disease, hypertension, diabetes, anxiety, depression, restless leg syndrome, and obesity who presents for follow-up consultation after her sleep study. I talked to her daughter at length about her sleep study results. I explained to her that her mother does not have any significant sleep disordered breathing. Average oxygen was 97% which is good, she did have a brief drop to 80% in the context of obstructive hypopneas but overall her AHI was less than 5 and she does not have any significant obstructive sleep apnea. She  had mild leg twitching later at night. This did not appear to cause any sleep disruption and the patient is not complaining currently of any restless leg symptoms. She slept in light stage sleep only and did not have any deep sleep or REM sleep, not unusual for PD patients. As far as her REM behavior disorder, she usually just calls out or laughs in her sleep. No major movements are reported and she has not had any recent issues with abnormal motor activity during sleep. If this should get worse she can be tried on low-dose clonazepam. As far as her daytime somnolence, this could be secondary to her Stalevo and also because of her constant dyskinesias, as she may be physically exhausted from moving all the time. I explained this to her daughters well. I suggested that they discuss with Dr. Jannifer Franklin the possibility of reducing Stalevo from 200 mg strength to 150 mg strength 3 times a day. They have an appointment pending for September. At this juncture, I suggested that they continue with the current management and I discussed the above with Dr. Jannifer Franklin. I can see her back on an as-needed basis. I answered all her questions today and the patient's daughter was in agreement.  I spent 15 minutes in total face-to-face time with the patient, more than 50% of which was spent in counseling and coordination of care, reviewing test results, reviewing medication and discussing or reviewing the diagnosis of PD and sleep d/o, the prognosis and treatment options.

## 2014-09-26 NOTE — Patient Instructions (Signed)
As discussed, there is no significant sleep disorder at this time.   For your daytime sleepiness and involuntary movements, I will ask Dr. Anne Hahn to consider reducing your Stalevo to 150 mg three times a day.   If your dream activity or yelling out in sleep gets worse, he may be able to try a low dose medication called clonazepam, 0.25 mg at bedtime, but we will not make any changes as yet.   Please keep your appointment with Dr. Anne Hahn in September.

## 2014-09-30 ENCOUNTER — Ambulatory Visit (HOSPITAL_COMMUNITY)
Admission: RE | Admit: 2014-09-30 | Discharge: 2014-09-30 | Disposition: A | Discharge status: Home / Self Care | Payer: Medicare Other | Source: Ambulatory Visit | Attending: Neurology | Admitting: Neurology

## 2014-09-30 ENCOUNTER — Ambulatory Visit (HOSPITAL_COMMUNITY): Admission: RE | Admit: 2014-09-30 | Payer: Medicare Other | Source: Ambulatory Visit

## 2014-10-25 ENCOUNTER — Other Ambulatory Visit: Payer: Self-pay | Admitting: Neurology

## 2014-12-28 ENCOUNTER — Other Ambulatory Visit: Payer: Self-pay | Admitting: Neurology

## 2014-12-30 ENCOUNTER — Encounter: Payer: Self-pay | Admitting: Neurology

## 2014-12-30 ENCOUNTER — Ambulatory Visit (INDEPENDENT_AMBULATORY_CARE_PROVIDER_SITE_OTHER): Payer: Medicare Other | Admitting: Neurology

## 2014-12-30 VITALS — BP 144/76 | HR 60

## 2014-12-30 DIAGNOSIS — R269 Unspecified abnormalities of gait and mobility: Secondary | ICD-10-CM

## 2014-12-30 DIAGNOSIS — G20 Parkinson's disease: Secondary | ICD-10-CM | POA: Diagnosis not present

## 2014-12-30 MED ORDER — SELEGILINE HCL 5 MG PO TABS: ORAL_TABLET | ORAL | Status: DC

## 2014-12-30 NOTE — Progress Notes (Signed)
Reason for visit: Parkinson's disease  [name] is an 81 y.o. female  History of present illness:  Ms. Starn is an 81 year old right-handed obese female with a history of Parkinson's disease. The family actually indicates that she is 81 years old instead of 22. The patient continues to have issues with her Parkinson's disease, she is very inactive, she has never had a desire to try to walk or remain physically active. She sleeps most of the day, the family indicates that she may sleep up to 20 hours daily. The patient remains on Stalevo, she will have some occasional dyskinesias. She denies any memory issues. The patient does have some difficulty with swallowing liquids, she will choke on occasion, this is not a significant issue for her, however. The patient wears adult diapers, she will have some urinary incontinence at times. She has never clearly gaining benefit from the use of Stalevo or Sinemet.  Past Medical History  Diagnosis Date  . Gait disorder   . Diabetes mellitus without complication   . Hypertension   . Anxiety   . Depression   . Restless leg syndrome   . REM sleep behavior disorder   . Cerebrovascular disease   . Meralgia paresthetica     Bilateral  . Movement disorder     Parkinson's disease  . Dysphagia, pharyngoesophageal phase 08/12/2014    Dysphagia for liquids    Past Surgical History  Procedure Laterality Date  . None      History reviewed. No pertinent family history.  Social history:  reports that she has never smoked. She has never used smokeless tobacco. She reports that she does not drink alcohol or use illicit drugs.   No Known Allergies  Medications:  Prior to Admission medications   Medication Sig Start Date End Date Taking? Authorizing Provider  carbidopa-levodopa-entacapone (STALEVO) 50-200-200 MG per tablet TAKE 1 TABLET BY MOUTH THREE TIMES DAILY 12/29/14  Yes York Spaniel, MD  chlorthalidone (HYGROTON) 50 MG tablet Take 50 mg  by mouth daily.   Yes Historical Provider, MD  Multiple Vitamin (MULTIVITAMIN) tablet Take 1 tablet by mouth daily.   Yes Historical Provider, MD    ROS:  Out of a complete 14 system review of symptoms, the patient complains only of the following symptoms, and all other reviewed systems are negative.  Fatigue Blurred vision Snoring Constipation Confusion, weakness, slurred speech, difficulty swallowing Too much sleep, decreased energy, change in appetite  Blood pressure 144/76, pulse 60.  Physical Exam  General: The patient is alert and cooperative at the time of the examination.  Skin: No significant peripheral edema is noted.   Neurologic Exam  Mental status: The patient is alert and oriented x 3 at the time of the examination.    Cranial nerves: Facial symmetry is present. Speech is normal, no aphasia or dysarthria is noted. Extraocular movements are full. Visual fields are full. Masking of the face is seen.  Motor: The patient has good strength in all 4 extremities.  Sensory examination: Soft touch sensation is symmetric on the face, arms, and legs.  Coordination: The patient has good finger-nose-finger and heel-to-shin bilaterally.  Gait and station: The patient requires some assistance with standing. Once up, she can take steps with assistance. She has short shuffling steps, some freezing with turns. The patient is able stand independently, Romberg is negative.  Reflexes: Deep tendon reflexes are symmetric.   Assessment/Plan:  1. Parkinson's disease  2. Gait disorder  The patient remains  very inactive during the day. She is having significant drowsiness throughout the day, she has been seen by Dr. Frances Furbish for this, she has good oxygen levels throughout the night, no indications for treatment. The patient will be continued on the Stalevo, we will add selegiline to the medication regimen to see if this helps some of the freezing issues and some of the drowsiness. The  patient will follow-up in 4 months. The family will contact me if the patient is not doing well on the medication.  Marlan Palau MD 12/30/2014 7:54 PM  [facility] [address] [place], [place] [address]  Phone [phone] Fax [phone]

## 2014-12-30 NOTE — Patient Instructions (Addendum)
   We will go on selegiline 5 mg tablet starting in the morning for 1 month, then take 1 in the morning and one at noon.  Parkinson Disease Parkinson disease is a disorder of the brain and spinal cord (central nervous system). The person will slowly lose the ability to control his or her body movements. This happens due to:  Damaged nerve cells.  Low levels of a certain brain chemical. HOME CARE  Exercise often.  Make time to rest during the day.  Take all medicine as told by your doctor.  Replace buttons and zippers with elastic and Velcro if getting dressed is difficult.  Put grab bars or rails in your home. This helps you to not fall.  Go to speech therapy or therapy to help you with daily activities (occupational therapy). Do this as told by your doctor.  Keep all doctor visits as told. GET HELP IF:  Your medicine does not help your symptoms.  You fall.  You have trouble swallowing or choke on your food. MAKE SURE YOU:  Understand these instructions.  Will watch your condition.  Will get help right away if you are not doing well or get worse. Document Released: 06/28/2011 Document Revised: 07/31/2012 Document Reviewed: 06/28/2011 [facility] Patient Information 2015 [place], [place]. This information is not intended to replace advice given to you by your health care provider. Make sure you discuss any questions you have with your health care provider.

## 2015-01-26 ENCOUNTER — Other Ambulatory Visit: Payer: Self-pay | Admitting: Neurology

## 2015-02-20 ENCOUNTER — Telehealth: Payer: Self-pay | Admitting: Neurology

## 2015-02-20 NOTE — Telephone Encounter (Signed)
Pt's son called sts she is sleeping about 22 hrs a day. She doesn't want to move, doesn't want to take shower, eat, get up, she has urinated on herself. He sts this has gotten worse since she started the last medication (he doesn't know the name of it) It's like she has given up. He is thinking she might need to go to nursing home. Please call and advise at [phone]

## 2015-02-20 NOTE — Telephone Encounter (Signed)
I called the family. The patient has been sleeping quite a bit throughout the day, selegiline was recently added, they will stop this. The patient normally sleeps 20 hours a day. If she is not improving off the medication, we will need to do blood work, and may consider cutting back on the dose of Stalevo.

## 2015-02-25 MED ORDER — CARBIDOPA-LEVODOPA-ENTACAPONE 25-100-200 MG PO TABS: 1.0000 | ORAL_TABLET | Freq: Three times a day (TID) | ORAL | Status: DC

## 2015-02-25 NOTE — Addendum Note (Signed)
Addended by: Stephanie AcreWILLIS, Myleigh Amara on: 02/25/2015 05:31 PM   Modules accepted: Orders, Medications

## 2015-02-25 NOTE — Telephone Encounter (Signed)
Pt' son called sts she is still sleeping, no change what so ever. Please call and advise

## 2015-02-25 NOTE — Telephone Encounter (Signed)
I called the family. The patient still is sleeping, I'll cut back on the dose of the Stalevo.

## 2015-02-26 NOTE — Telephone Encounter (Signed)
[name] [phone] returned call, wasn't clear about message that was left. Please call back to give clarification.

## 2015-02-26 NOTE — Telephone Encounter (Signed)
Spoke to son. Advised Dr. Anne HahnWillis E scribed new Stalevo Rx w/ change in strength. Son verbalized understanding.

## 2015-04-22 ENCOUNTER — Other Ambulatory Visit: Payer: Self-pay | Admitting: Neurology

## 2015-06-02 ENCOUNTER — Ambulatory Visit: Payer: Medicare Other | Admitting: Neurology

## 2015-06-04 ENCOUNTER — Ambulatory Visit (INDEPENDENT_AMBULATORY_CARE_PROVIDER_SITE_OTHER): Payer: Medicare Other | Admitting: Neurology

## 2015-06-04 ENCOUNTER — Encounter: Payer: Self-pay | Admitting: Neurology

## 2015-06-04 VITALS — BP 121/62 | HR 56 | Ht 60.0 in

## 2015-06-04 DIAGNOSIS — G20 Parkinson's disease: Secondary | ICD-10-CM | POA: Diagnosis not present

## 2015-06-04 DIAGNOSIS — R269 Unspecified abnormalities of gait and mobility: Secondary | ICD-10-CM

## 2015-06-04 MED ORDER — CARBIDOPA-LEVODOPA-ENTACAPONE 25-100-200 MG PO TABS: 1.0000 | ORAL_TABLET | Freq: Three times a day (TID) | ORAL | Status: DC

## 2015-06-04 NOTE — Progress Notes (Signed)
Reason for visit: Parkinson's disease  [name] is an 82 y.o. female  History of present illness:  [name] is an 82 year old right-handed Asian female with a history of Parkinson's disease associated with a gait disorder. The patient appears to be unwilling to remain active, she does not want to walk. She does walk short distances when that family forces her to, but she has a tendency to lean backwards, and they have to be with her at all times. Most of the day, she sits in a chair doing very little. She has developed a short-term memory problem, she has done better with her drowsiness when the Stalevo dose was cut back. She still naps off and on during the day, she may have either hallucinations or some issues with vivid dreams in that she may see monkeys outside of the house. She may have some issues with choking with swallowing at times. Her appetite overall is better on a lower dose of the Stalevo. She returns to this office for an evaluation. She does have dyskinesias throughout the day.  Past Medical History  Diagnosis Date  . Gait disorder   . Diabetes mellitus without complication (HCC)   . Hypertension   . Anxiety   . Depression   . Restless leg syndrome   . REM sleep behavior disorder   . Cerebrovascular disease   . Meralgia paresthetica     Bilateral  . Movement disorder     Parkinson's disease  . Dysphagia, pharyngoesophageal phase 08/12/2014    Dysphagia for liquids    Past Surgical History  Procedure Laterality Date  . None      History reviewed. No pertinent family history.  Social history:  reports that she has never smoked. She has never used smokeless tobacco. She reports that she does not drink alcohol or use illicit drugs.   No Known Allergies  Medications:  Prior to Admission medications   Medication Sig Start Date End Date Taking? Authorizing Provider  carbidopa-levodopa-entacapone (STALEVO) 25-100-200 MG tablet TAKE 1 TABLET BY MOUTH THREE  TIMES DAILY 04/23/15  Yes York Spaniel, MD  chlorthalidone (HYGROTON) 50 MG tablet Take 50 mg by mouth daily.   Yes Historical Provider, MD  Multiple Vitamin (MULTIVITAMIN) tablet Take 1 tablet by mouth daily.   Yes Historical Provider, MD    ROS:  Out of a complete 14 system review of symptoms, the patient complains only of the following symptoms, and all other reviewed systems are negative.  Fatigue Difficulty swallowing, drooling Eye itching Shortness of breath Constipation Restless legs, daytime sleepiness, sleep talking Walking difficulty Speech difficulty, weakness Confusion, hallucinations  Blood pressure 121/62, pulse 56, height 5' (1.524 m).  Physical Exam  General: The patient is alert and cooperative at the time of the examination.  Skin: No significant peripheral edema is noted.   Neurologic Exam  Mental status: The patient is alert and oriented x 3 at the time of the examination. The patient has apparent normal recent and remote memory, with an apparently normal attention span and concentration ability.   Cranial nerves: Facial symmetry is present. Speech is normal, no aphasia or dysarthria is noted. Extraocular movements are full. Visual fields are full, with exception of some restriction of superior gaze. Masking of the face is seen.  Motor: The patient has good strength in all 4 extremities.  Sensory examination: Soft touch sensation is symmetric on the face, arms, and legs.  Coordination: The patient has good finger-nose-finger and heel-to-shin bilaterally.  Gait and station: The patient will require some assistance with standing, once up, she can walk short distances with assistance, taking short shuffling steps. She will freeze some when initiating ambulation, and with turns. Tandem gait was not attempted. Romberg is negative. No drift is seen.  Reflexes: Deep tendon reflexes are symmetric.   Assessment/Plan:  1. Parkinson's disease  2. Gait  disorder  3. Memory disorder  The patient has never been very active, this has contributed to progression of her Parkinson's symptoms. She has done better on a lower dose of Stalevo with less sedation throughout the day. The patient has developed a memory issue, she may be having some hallucinations at times. She requires assistance with bathing and dressing, and with ambulation. She will follow-up in 5 months, a prescription was called in for her Stalevo.  Marlan Palau MD 06/04/2015 7:01 PM  [facility] [address] [place], [place] [address]  Phone [phone] Fax [phone]

## 2015-06-04 NOTE — Patient Instructions (Signed)
Fall Prevention in the Home  Falls can cause injuries and can affect people from all age groups. There are many simple things that you can do to make your home safe and to help prevent falls. WHAT CAN I DO ON THE OUTSIDE OF MY HOME?  Regularly repair the edges of walkways and driveways and fix any cracks.  Remove high doorway thresholds.  Trim any shrubbery on the main path into your home.  Use bright outdoor lighting.  Clear walkways of debris and clutter, including tools and rocks.  Regularly check that handrails are securely fastened and in good repair. Both sides of any steps should have handrails.  Install guardrails along the edges of any raised decks or porches.  Have leaves, snow, and ice cleared regularly.  Use sand or salt on walkways during winter months.  In the garage, clean up any spills right away, including grease or oil spills. WHAT CAN I DO IN THE BATHROOM?  Use night lights.  Install grab bars by the toilet and in the tub and shower. Do not use towel bars as grab bars.  Use non-skid mats or decals on the floor of the tub or shower.  If you need to sit down while you are in the shower, use a plastic, non-slip stool..  Keep the floor dry. Immediately clean up any water that spills on the floor.  Remove soap buildup in the tub or shower on a regular basis.  Attach bath mats securely with double-sided non-slip rug tape.  Remove throw rugs and other tripping hazards from the floor. WHAT CAN I DO IN THE BEDROOM?  Use night lights.  Make sure that a bedside light is easy to reach.  Do not use oversized bedding that drapes onto the floor.  Have a firm chair that has side arms to use for getting dressed.  Remove throw rugs and other tripping hazards from the floor. WHAT CAN I DO IN THE KITCHEN?   Clean up any spills right away.  Avoid walking on wet floors.  Place frequently used items in easy-to-reach places.  If you need to reach for something  above you, use a sturdy step stool that has a grab bar.  Keep electrical cables out of the way.  Do not use floor polish or wax that makes floors slippery. If you have to use wax, make sure that it is non-skid floor wax.  Remove throw rugs and other tripping hazards from the floor. WHAT CAN I DO IN THE STAIRWAYS?  Do not leave any items on the stairs.  Make sure that there are handrails on both sides of the stairs. Fix handrails that are broken or loose. Make sure that handrails are as long as the stairways.  Check any carpeting to make sure that it is firmly attached to the stairs. Fix any carpet that is loose or worn.  Avoid having throw rugs at the top or bottom of stairways, or secure the rugs with carpet tape to prevent them from moving.  Make sure that you have a light switch at the top of the stairs and the bottom of the stairs. If you do not have them, have them installed. WHAT ARE SOME OTHER FALL PREVENTION TIPS?  Wear closed-toe shoes that fit well and support your feet. Wear shoes that have rubber soles or low heels.  When you use a stepladder, make sure that it is completely opened and that the sides are firmly locked. Have someone hold the ladder while you   are using it. Do not climb a closed stepladder.  Add color or contrast paint or tape to grab bars and handrails in your home. Place contrasting color strips on the first and last steps.  Use mobility aids as needed, such as canes, walkers, scooters, and crutches.  Turn on lights if it is dark. Replace any light bulbs that burn out.  Set up furniture so that there are clear paths. Keep the furniture in the same spot.  Fix any uneven floor surfaces.  Choose a carpet design that does not hide the edge of steps of a stairway.  Be aware of any and all pets.  Review your medicines with your healthcare provider. Some medicines can cause dizziness or changes in blood pressure, which increase your risk of falling. Talk  with your health care provider about other ways that you can decrease your risk of falls. This may include working with a physical therapist or trainer to improve your strength, balance, and endurance.   This information is not intended to replace advice given to you by your health care provider. Make sure you discuss any questions you have with your health care provider.   Document Released: 03/26/2002 Document Revised: 08/20/2014 Document Reviewed: 05/10/2014 Elsevier Interactive Patient Education 2016 [employer]  

## 2015-11-19 ENCOUNTER — Encounter: Payer: Self-pay | Admitting: Neurology

## 2015-11-19 ENCOUNTER — Ambulatory Visit (INDEPENDENT_AMBULATORY_CARE_PROVIDER_SITE_OTHER): Payer: Medicare Other | Admitting: Neurology

## 2015-11-19 VITALS — BP 144/66 | HR 54 | Ht 60.0 in | Wt 118.0 lb

## 2015-11-19 DIAGNOSIS — G20A1 Parkinson's disease without dyskinesia, without mention of fluctuations: Secondary | ICD-10-CM

## 2015-11-19 DIAGNOSIS — G20 Parkinson's disease: Secondary | ICD-10-CM | POA: Diagnosis not present

## 2015-11-19 DIAGNOSIS — R269 Unspecified abnormalities of gait and mobility: Secondary | ICD-10-CM | POA: Diagnosis not present

## 2015-11-19 MED ORDER — CARBIDOPA-LEVODOPA-ENTACAPONE 25-100-200 MG PO TABS: 1.0000 | ORAL_TABLET | Freq: Three times a day (TID) | ORAL | 3 refills | Status: DC

## 2015-11-19 NOTE — Progress Notes (Signed)
Reason for visit: Parkinson's disease  [name] is an 82 y.o. female  History of present illness:  [name] is an 82 year old right-handed Asian female with a history of Parkinson's disease. The patient has a gait disorder with this, the patient is relatively in active. The family indicates that when she is alone, she can do things for herself, when other individuals are around, she wants other people to do things for her. The patient has had a reduction in the dose of her medication, Stalevo. This has resulted in improved problems with drowsiness, she has not reported any hallucinations. The patient will choke on liquids at times, otherwise she does not have any swallowing problems. She has not had any falls. The family indicates that her memory is doing well. She returns for an evaluation.  Past Medical History:  Diagnosis Date  . Anxiety   . Cerebrovascular disease   . Depression   . Diabetes mellitus without complication (HCC)   . Dysphagia, pharyngoesophageal phase 08/12/2014   Dysphagia for liquids  . Gait disorder   . Hypertension   . Meralgia paresthetica    Bilateral  . Movement disorder    Parkinson's disease  . REM sleep behavior disorder   . Restless leg syndrome     Past Surgical History:  Procedure Laterality Date  . None      History reviewed. No pertinent family history.  Social history:  reports that she has never smoked. She has never used smokeless tobacco. She reports that she does not drink alcohol or use drugs.   No Known Allergies  Medications:  Prior to Admission medications   Medication Sig Start Date End Date Taking? Authorizing Provider  carbidopa-levodopa-entacapone (STALEVO) 25-100-200 MG tablet Take 1 tablet by mouth 3 (three) times daily. 06/04/15   York Spaniel, MD  chlorthalidone (HYGROTON) 50 MG tablet Take 50 mg by mouth daily.    Historical Provider, MD  Multiple Vitamin (MULTIVITAMIN) tablet Take 1 tablet by mouth daily.     Historical Provider, MD    ROS:  Out of a complete 14 system review of symptoms, the patient complains only of the following symptoms, and all other reviewed systems are negative.  Fatigue Difficulty swallowing Blurred vision Constipation Daytime sleepiness Walking difficulty  Blood pressure (!) 144/66, pulse (!) 54, height 5' (1.524 m), weight 118 lb (53.5 kg).  Physical Exam  General: The patient is alert and cooperative at the time of the examination.  Skin: No significant peripheral edema is noted.   Neurologic Exam  Mental status: The patient is alert and cooperative. The patient does not speak [place]. The patient follows commands readily.   Cranial nerves: Facial symmetry is present. Speech is normal, no aphasia or dysarthria is noted. Extraocular movements are full. Visual fields are full. Masking of the face is seen.  Motor: The patient has good strength in all 4 extremities.  Sensory examination: Soft touch sensation is symmetric on the face, arms, and legs.  Coordination: The patient has good finger-nose-finger and heel-to-shin bilaterally. She has some mild dyskinetic movements of the legs.  Gait and station: The patient can stand with minimal assistance. The patient is able to walk with assistance, takes short shuffling steps. Romberg is negative.  Reflexes: Deep tendon reflexes are symmetric.   Assessment/Plan:  1. Parkinson's disease  2. Gait disturbance  The patient is not very well motivated to perform physical activity during the day. The patient looks better on a lower dose of  Stalevo. We will not alter the dose today, we will have her follow-up in 6 months. If the swallowing issues worsen, we will do a swallowing evaluation. A prescription was written for the Stalevo.  Marlan Palau MD 11/19/2015 10:29 AM  [facility] [address] [place], [place] [address]  Phone [phone] Fax [phone]

## 2016-01-14 ENCOUNTER — Other Ambulatory Visit: Payer: Self-pay | Admitting: Obstetrics & Gynecology

## 2016-01-15 LAB — CYTOLOGY - PAP

## 2016-02-11 ENCOUNTER — Other Ambulatory Visit: Payer: Self-pay | Admitting: Obstetrics & Gynecology

## 2016-05-24 ENCOUNTER — Encounter: Payer: Self-pay | Admitting: Neurology

## 2016-05-24 ENCOUNTER — Ambulatory Visit (INDEPENDENT_AMBULATORY_CARE_PROVIDER_SITE_OTHER): Payer: Medicare Other | Admitting: Neurology

## 2016-05-24 VITALS — BP 153/66 | HR 51 | Temp 97.0°F | Ht 60.0 in | Wt 119.0 lb

## 2016-05-24 DIAGNOSIS — R269 Unspecified abnormalities of gait and mobility: Secondary | ICD-10-CM | POA: Diagnosis not present

## 2016-05-24 DIAGNOSIS — G20 Parkinson's disease: Secondary | ICD-10-CM | POA: Diagnosis not present

## 2016-05-24 NOTE — Progress Notes (Signed)
Reason for visit: Parkinson's disease  [name] is an 83 y.o. female  History of present illness:  [name] is an 83 year old right-handed Asian female with a history of Parkinson's disease associated with a gait disorder. [name] remains relatively sedentary throughout [name] day, she does not wish to walk much. She has had 2 falls since last seen. She occasionally may have dyskinesias during [name] day. [name] does have some issues with swallowing, liquids give her more problems than solids. This has not gotten much worse since last seen. [name] will walk with a walker, at times she can walk by herself. She does have issues with freezing. On higher doses of Stalevo, she has had a lot of dyskinesias. She seemed to function better on a lower dose. She will sleep fairly well at night. She has not had any signs of dementia or hallucinations.  Past Medical History:  Diagnosis Date  . Anxiety   . Cerebrovascular disease   . Depression   . Diabetes mellitus without complication (HCC)   . Dysphagia, pharyngoesophageal phase 08/12/2014   Dysphagia for liquids  . Gait disorder   . Hypertension   . Meralgia paresthetica    Bilateral  . Movement disorder    Parkinson's disease  . REM sleep behavior disorder   . Restless leg syndrome     Past Surgical History:  Procedure Laterality Date  . None      History reviewed. No pertinent family history.  Social history:  reports that she has never smoked. She has never used smokeless tobacco. She reports that she does not drink alcohol or use drugs.   No Known Allergies  Medications:  Prior to Admission medications   Medication Sig Start Date End Date Taking? Authorizing Provider  carbidopa-levodopa-entacapone (STALEVO) 25-100-200 MG tablet Take 1 tablet by mouth 3 (three) times daily. 11/19/15  Yes York Spanielharles K Shawnelle Spoerl, MD  chlorthalidone (HYGROTON) 50 MG tablet Take 50 mg by mouth daily.   Yes Historical Provider, MD  Multiple  Vitamin (MULTIVITAMIN) tablet Take 1 tablet by mouth daily.   Yes Historical Provider, MD    ROS:  Out of a complete 14 system review of symptoms, [name] complains only of [name] following symptoms, and all other reviewed systems are negative.  Fatigue Blurred vision Constipation Walking difficulty Weakness  Blood pressure (!) 153/66, pulse (!) 51, temperature 97 F (36.1 C), temperature source Oral, height 5' (1.524 m), weight 119 lb (54 kg).  Physical Exam  General: [name] is alert and cooperative at [name] time of [name] examination.  Skin: No significant peripheral edema is noted.   Neurologic Exam  Mental status: [name] is alert and oriented x 3 at [name] time of [name] examination. [name] family indicates that [name] has normal mentation. She does not speak [place].   Cranial nerves: Facial symmetry is present. Speech is normal, no aphasia or dysarthria is noted. Extraocular movements are full. Visual fields are full to threat. Masking of [name] face is seen.  Motor: [name] has good strength in all 4 extremities.  Sensory examination: Soft touch sensation is symmetric on [name] face, arms, and legs.  Coordination: [name] has good finger-nose-finger and heel-to-shin bilaterally.  Gait and station: [name] requires some assistance with standing. Once up, [name] can walk short distances with assistance. She will take short shuffling steps, she has difficulty with turns and she will freeze at times. Tandem gait was not attempted.  Reflexes: Deep  tendon reflexes are symmetric.   Assessment/Plan:  1. Parkinson's disease  2. Gait disorder  [name] will be continued on her current dose of Stalevo. She will follow-up in 6 months, [name]'s family will contact our office if new issues arise.  Marlan Palau MD 05/24/2016 10:13 AM  [facility] [address] [place], [place] [address]  Phone [phone] Fax  [phone]

## 2016-11-29 ENCOUNTER — Ambulatory Visit (INDEPENDENT_AMBULATORY_CARE_PROVIDER_SITE_OTHER): Payer: Medicare Other | Admitting: Adult Health

## 2016-11-29 ENCOUNTER — Encounter: Payer: Self-pay | Admitting: Adult Health

## 2016-11-29 VITALS — BP 121/79 | HR 70 | Ht 60.0 in | Wt 111.4 lb

## 2016-11-29 DIAGNOSIS — G20 Parkinson's disease: Secondary | ICD-10-CM

## 2016-11-29 DIAGNOSIS — G4719 Other hypersomnia: Secondary | ICD-10-CM

## 2016-11-29 NOTE — Progress Notes (Signed)
I have read the note, and I agree with the clinical assessment and plan.  Loomis Anacker KEITH   

## 2016-11-29 NOTE — Patient Instructions (Signed)
Stalevo can be reduced to 1 tablet twice a day to see if drowsiness improves. However if parkinson symptoms worsen then this may need to be increased back to 1 tablet three times a day If your symptoms worsen or you develop new symptoms please let us know.

## 2016-11-29 NOTE — Progress Notes (Signed)
PATIENT: [name] DOB: [birth year]  REASON FOR VISIT: follow up-Parkinson's disease HISTORY FROM: patient  HISTORY OF PRESENT ILLNESS: Today 11/29/16 [name] is a 83 year old female with a history of Parkinson's disease. She returns today for follow-up. She is currently on Stalevo taking one tablet 3 times a day. She is with her daughter today. Her daughter is also translating. The patient does not speak [place]. The patient is very drowsy today. Her daughter reports that she typically sleeps most of the day. The daughter states that she may be awake most of the night she has not wondering around. She typically lays in bed. The family assists her with all ADLs. She does report that she has a good appetite. She uses a wheelchair primarily to ambulate. Family assists her with all transfers. She occasionally will go to the bathroom but typically wears a diaper. In the past she has been referred to Dr. Frances Furbish for evaluation of excessive sleepiness. A sleep study did not show sleep apnea. In the past it was reported that a decrease in Stalevo improved her drowsiness. Daughter states that she is now starting any new medications. She returns today for an evaluation.  HISTORY 05/24/16: [name] is an 83 year old right-handed Asian female with a history of Parkinson's disease associated with a gait disorder. The patient remains relatively sedentary throughout the day, she does not wish to walk much. She has had 2 falls since last seen. She occasionally may have dyskinesias during the day. The patient does have some issues with swallowing, liquids give her more problems than solids. This has not gotten much worse since last seen. The patient will walk with a walker, at times she can walk by herself. She does have issues with freezing. On higher doses of Stalevo, she has had a lot of dyskinesias. She seemed to function better on a lower dose. She will sleep fairly well at night. She has not had any signs of  dementia or hallucinations.   REVIEW OF SYSTEMS: Out of a complete 14 system review of symptoms, the patient complains only of the following symptoms, and all other reviewed systems are negative.  Constipation, daytime sleepiness, snoring, sleep talking, walking difficulty, weakness, constipation, blurred vision, fatigue, drooling  ALLERGIES: No Known Allergies  HOME MEDICATIONS: Outpatient Medications Prior to Visit  Medication Sig Dispense Refill  . carbidopa-levodopa-entacapone (STALEVO) 25-100-200 MG tablet Take 1 tablet by mouth 3 (three) times daily. 270 tablet 3  . chlorthalidone (HYGROTON) 50 MG tablet Take 50 mg by mouth daily.    . Multiple Vitamin (MULTIVITAMIN) tablet Take 1 tablet by mouth daily.     No facility-administered medications prior to visit.     PAST MEDICAL HISTORY: Past Medical History:  Diagnosis Date  . Anxiety   . Cerebrovascular disease   . Depression   . Diabetes mellitus without complication (HCC)   . Dysphagia, pharyngoesophageal phase 08/12/2014   Dysphagia for liquids  . Gait disorder   . Hypertension   . Meralgia paresthetica    Bilateral  . Movement disorder    Parkinson's disease  . REM sleep behavior disorder   . Restless leg syndrome     PAST SURGICAL HISTORY: Past Surgical History:  Procedure Laterality Date  . None      FAMILY HISTORY: No family history on file.  SOCIAL HISTORY: Social History   Social History  . Marital status: Divorced    Spouse name: N/A  . Number of children: 11  . Years of  education: 4 th   Occupational History  . N/A     retired   Social History Main Topics  . Smoking status: Never Smoker  . Smokeless tobacco: Never Used  . Alcohol use No  . Drug use: No  . Sexual activity: Not on file   Other Topics Concern  . Not on file   Social History Narrative   Patient is widowed . Patient lives with her daughter [name] [phone].   Retired.   Education 4 th grade.   Right  handed.   Caffeine - 3-4 cups of tea daily      PHYSICAL EXAM  Vitals:   11/29/16 1023  BP: 121/79  Pulse: 70  Weight: 111 lb 6.4 oz (50.5 kg)  Height: 5' (1.524 m)   Body mass index is 21.76 kg/m.  Generalized: Well developed, in no acute distress   Neurological examination  Mentation: Drowsy. Alert to person. Follows all commands. Speech is limited. Cranial nerve II-XII: Pupils were equal round reactive to light. Extraocular movements were full, visual field were full on confrontational test. Facial sensation and strength were normal. Uvula tongue midline. Head turning and shoulder shrug  were normal and symmetric. Motor: The motor testing reveals 5 over 5 strength of all 4 extremities. Good symmetric motor tone is noted throughout.  Sensory: Sensory testing is intact to soft touch on all 4 extremities. No evidence of extinction is noted.  Coordination: Cerebellar testing reveals good finger-nose-finger and heel-to-shin bilaterally.  Gait and station: Patient is in a wheelchair.   DIAGNOSTIC DATA (LABS, IMAGING, TESTING) - I reviewed patient records, labs, notes, testing and imaging myself where available.    ASSESSMENT AND PLAN 83 y.o. year old female  has a past medical history of Anxiety; Cerebrovascular disease; Depression; Diabetes mellitus without complication (HCC); Dysphagia, pharyngoesophageal phase (08/12/2014); Gait disorder; Hypertension; Meralgia paresthetica; Movement disorder; REM sleep behavior disorder; and Restless leg syndrome. here with:  1. Parkinson's disease 2. Excessive daytime sleepiness  After reviewing the notes it seems that the patient and her family have reported excessive daytime sleepiness multiple times in the past. It was also reported that a decrease in Stalevo did help her drowsiness. I have instructed the daughter to decrease her dose to one tablet twice a day to see if this offers any benefit with her drowsiness. However I did advise that  if her symptoms related to Parkinson's worsen then the dose may have to be increased back to 3 times a day. Daughter verbalized understanding. If her symptoms worsen or she develops new symptoms she should let us know. She will follow-up in 6 months or sooner if needed.    Butch PennyMegan Forrestine Lecrone, MSN, NP-C 11/29/2016, 10:05 AM [facility] [address], [address] [place], [place] [address] [phone]

## 2016-12-13 ENCOUNTER — Other Ambulatory Visit: Payer: Self-pay | Admitting: Neurology

## 2017-03-12 ENCOUNTER — Other Ambulatory Visit: Payer: Self-pay | Admitting: Adult Health

## 2017-05-23 ENCOUNTER — Encounter (HOSPITAL_COMMUNITY): Payer: Self-pay | Admitting: Emergency Medicine

## 2017-05-23 ENCOUNTER — Emergency Department (HOSPITAL_COMMUNITY)
Admission: EM | Admit: 2017-05-23 | Discharge: 2017-05-23 | Disposition: A | Discharge status: Home / Self Care | Payer: Medicare Other | Attending: Emergency Medicine | Admitting: Emergency Medicine

## 2017-05-23 ENCOUNTER — Emergency Department (HOSPITAL_COMMUNITY): Payer: Medicare Other

## 2017-05-23 DIAGNOSIS — E119 Type 2 diabetes mellitus without complications: Secondary | ICD-10-CM | POA: Diagnosis not present

## 2017-05-23 DIAGNOSIS — Y999 Unspecified external cause status: Secondary | ICD-10-CM | POA: Insufficient documentation

## 2017-05-23 DIAGNOSIS — Y92018 Other place in single-family (private) house as the place of occurrence of the external cause: Secondary | ICD-10-CM | POA: Diagnosis not present

## 2017-05-23 DIAGNOSIS — W01198A Fall on same level from slipping, tripping and stumbling with subsequent striking against other object, initial encounter: Secondary | ICD-10-CM | POA: Insufficient documentation

## 2017-05-23 DIAGNOSIS — W19XXXA Unspecified fall, initial encounter: Secondary | ICD-10-CM

## 2017-05-23 DIAGNOSIS — Y9389 Activity, other specified: Secondary | ICD-10-CM | POA: Insufficient documentation

## 2017-05-23 DIAGNOSIS — S0101XA Laceration without foreign body of scalp, initial encounter: Secondary | ICD-10-CM

## 2017-05-23 DIAGNOSIS — G20 Parkinson's disease: Secondary | ICD-10-CM | POA: Insufficient documentation

## 2017-05-23 DIAGNOSIS — Z23 Encounter for immunization: Secondary | ICD-10-CM | POA: Insufficient documentation

## 2017-05-23 DIAGNOSIS — I10 Essential (primary) hypertension: Secondary | ICD-10-CM | POA: Diagnosis not present

## 2017-05-23 DIAGNOSIS — Z79899 Other long term (current) drug therapy: Secondary | ICD-10-CM | POA: Insufficient documentation

## 2017-05-23 DIAGNOSIS — S0990XA Unspecified injury of head, initial encounter: Secondary | ICD-10-CM | POA: Diagnosis present

## 2017-05-23 MED ORDER — TETANUS-DIPHTH-ACELL PERTUSSIS 5-2.5-18.5 LF-MCG/0.5 IM SUSP
0.5000 mL | Freq: Once | INTRAMUSCULAR | Status: AC
  Administered 2017-05-23: 0.5 mL via INTRAMUSCULAR
  Filled 2017-05-23: qty 0.5

## 2017-05-23 MED ORDER — ACETAMINOPHEN 325 MG PO TABS
650.0000 mg | ORAL_TABLET | Freq: Once | ORAL | Status: AC
  Administered 2017-05-23: 650 mg via ORAL
  Filled 2017-05-23: qty 2

## 2017-05-23 MED ORDER — BACITRACIN ZINC 500 UNIT/GM EX OINT
1.0000 "application " | TOPICAL_OINTMENT | Freq: Once | CUTANEOUS | Status: AC
  Administered 2017-05-23: 1 via TOPICAL
  Filled 2017-05-23: qty 0.9

## 2017-05-23 MED ORDER — LIDOCAINE HCL (PF) 1 % IJ SOLN
5.0000 mL | Freq: Once | INTRAMUSCULAR | Status: AC
  Administered 2017-05-23: 5 mL via INTRADERMAL
  Filled 2017-05-23: qty 30

## 2017-05-23 NOTE — ED Provider Notes (Signed)
[facility]-EMERGENCY DEPT Provider Note   CSN: [id number] Arrival date & time: 05/23/17  1239     History   Chief Complaint Chief Complaint  Patient presents with  . Fall  . Head Injury   HPI   Blood pressure (!) 177/76, pulse 74, temperature 98 F (36.7 C), temperature source Oral, resp. rate 20, SpO2 100 %.  [name] is a 84 y.o. female complaining of laceration to left scalp this morning when her son was helping her fall, she does not ambulate independently, the son was unable to keep her upright and she slid hitting her head on the corner of a wall.  Last tetanus shot is unknown.  Patient is not anticoagulated, there is no loss of consciousness, no nausea or vomiting, she is acting normally.  She denies any other trauma, injury or pain at this time.   Past Medical History:  Diagnosis Date  . Anxiety   . Cerebrovascular disease   . Depression   . Diabetes mellitus without complication (HCC)   . Dysphagia, pharyngoesophageal phase 08/12/2014   Dysphagia for liquids  . Gait disorder   . Hypertension   . Meralgia paresthetica    Bilateral  . Movement disorder    Parkinson's disease  . REM sleep behavior disorder   . Restless leg syndrome     Patient Active Problem List   Diagnosis Date Noted  . Dysphagia, pharyngoesophageal phase 08/12/2014  . Meralgia paresthetica 07/10/2012  . REM sleep behavior disorder 07/10/2012  . Abnormality of gait 07/10/2012  . Paralysis agitans (HCC) 07/10/2012    Past Surgical History:  Procedure Laterality Date  . None      OB History    No data available       Home Medications    Prior to Admission medications   Medication Sig Start Date End Date Taking? Authorizing Provider  carbidopa-levodopa-entacapone (STALEVO) 25-100-200 MG tablet TAKE 1 TABLET BY MOUTH THREE TIMES DAILY Patient taking differently: TAKE 1 TABLET BY MOUTH BID DAILY 03/14/17  Yes York Spaniel, MD  lisinopril  (PRINIVIL,ZESTRIL) 2.5 MG tablet Take 2.5 mg by mouth daily.   Yes [provider]  Multiple Vitamin (MULTIVITAMIN) tablet Take 1 tablet by mouth daily.   Yes [provider]  carbidopa-levodopa-entacapone (STALEVO) 25-100-200 MG tablet Take by mouth. 02/25/15   [provider]  chlorthalidone (HYGROTON) 50 MG tablet Take 50 mg by mouth daily.    [provider]  lisinopril (PRINIVIL,ZESTRIL) 2.5 MG tablet Take by mouth. 12/13/16   [provider]    Family History No family history on file.  Social History Social History   Tobacco Use  . Smoking status: Never Smoker  . Smokeless tobacco: Never Used  Substance Use Topics  . Alcohol use: No    Alcohol/week: 0.0 oz  . Drug use: No     Allergies   Patient has no known allergies.   Review of Systems Review of Systems  A complete review of systems was obtained and all systems are negative except as noted in the HPI and PMH.   Physical Exam Updated Vital Signs BP (!) 177/76 (BP Location: Right Arm)   Pulse 74   Temp 98 F (36.7 C) (Oral)   Resp 20   SpO2 100%   Physical Exam  Constitutional: She is oriented to person, place, and time. She appears well-developed and well-nourished. No distress.  HENT:  Head: Normocephalic.    Mouth/Throat: Oropharynx is clear and moist.  2.5 cm full-thickness laceration to scalp as diagrammed, no underlying crepitance  Eyes: Conjunctivae and EOM are normal. Pupils are equal, round, and reactive to light.  Neck: Normal range of motion.  Cardiovascular: Normal rate, regular rhythm and intact distal pulses.  Pulmonary/Chest: Effort normal and breath sounds normal.  Abdominal: Soft. There is no tenderness.  Musculoskeletal: Normal range of motion.  Neurological: She is alert and oriented to person, place, and time.  II-Visual fields grossly intact. III/IV/VI-Extraocular movements intact.  Pupils reactive bilaterally. V/VII-Smile symmetric,  equal eyebrow raise,  facial sensation intact VIII- Hearing grossly intact IX/X-Normal gag XI-bilateral shoulder shrug   Skin: She is not diaphoretic.  Psychiatric: She has a normal mood and affect.  Nursing note and vitals reviewed.    ED Treatments / Results  Labs (all labs ordered are listed, but only abnormal results are displayed) Labs Reviewed - No data to display  EKG  EKG Interpretation None       Radiology Ct Head Wo Contrast  Result Date: 05/23/2017 CLINICAL DATA:  Fall. EXAM: CT HEAD WITHOUT CONTRAST TECHNIQUE: Contiguous axial images were obtained from the base of the skull through the vertex without intravenous contrast. COMPARISON:  MRI brain dated May 28, 2010. FINDINGS: Brain: No evidence of acute infarction, hemorrhage, hydrocephalus, extra-axial collection or mass lesion/mass effect. Stable mild cerebral atrophy and chronic microvascular ischemic changes. Unchanged small lacunar infarct in the right cerebellum. Small chronic lacunar infarct in the left paramidline pons. Vascular: Atherosclerotic vascular calcification of the carotid siphons. No hyperdense vessel. Skull: Normal. Negative for fracture or focal lesion. Sinuses/Orbits: No acute finding. Other: Small left frontal scalp laceration. IMPRESSION: 1. Small left frontal scalp laceration. No acute intracranial abnormality. 2. Stable mild cerebral atrophy and chronic microvascular ischemic changes. Electronically Signed   By: Obie Dredge M.D.   On: 05/23/2017 15:55    Procedures .Marland KitchenLaceration Repair Date/Time: 05/23/2017 4:58 PM Performed by: Wynetta Emery, PA-C Authorized by: Wynetta Emery, PA-C   Consent:    Consent obtained:  Verbal   Consent given by:  Patient Anesthesia (see MAR for exact dosages):    Anesthesia method:  Local infiltration   Local anesthetic:  Lidocaine 1% w/o epi Laceration details:    Location:  Scalp   Length (cm):  2.5 Repair type:    Repair type:   Simple Exploration:    Contaminated: no   Treatment:    Area cleansed with:  Saline   Amount of cleaning:  Standard   Visualized foreign bodies/material removed: no   Skin repair:    Repair method:  Staples   Number of staples:  3 Approximation:    Approximation:  Close   Vermilion border: well-aligned   Post-procedure details:    Dressing:  Antibiotic ointment   Patient tolerance of procedure:  Tolerated well, no immediate complications   (including critical care time)  Medications Ordered in ED Medications  Tdap (BOOSTRIX) injection 0.5 mL (0.5 mLs Intramuscular Given 05/23/17 1556)  lidocaine (PF) (XYLOCAINE) 1 % injection 5 mL (5 mLs Intradermal Given by Other 05/23/17 1556)  acetaminophen (TYLENOL) tablet 650 mg (650 mg Oral Given 05/23/17 1555)  bacitracin ointment 1 application (1 application Topical Given 05/23/17 1651)     Initial Impression / Assessment and Plan / ED Course  I have reviewed the triage vital signs and the nursing notes.  Pertinent labs & imaging results that were available during my care of the patient were reviewed by me and considered in my medical decision making (see  chart for details).     Vitals:   05/23/17 1600  BP: (!) 177/76  Pulse: 74  Resp: 20  Temp: 98 F (36.7 C)  TempSrc: Oral  SpO2: 100%    Medications  Tdap (BOOSTRIX) injection 0.5 mL (0.5 mLs Intramuscular Given 05/23/17 1556)  lidocaine (PF) (XYLOCAINE) 1 % injection 5 mL (5 mLs Intradermal Given by Other 05/23/17 1556)  acetaminophen (TYLENOL) tablet 650 mg (650 mg Oral Given 05/23/17 1555)  bacitracin ointment 1 application (1 application Topical Given 05/23/17 1651)    [name] is 84 y.o. female presenting with mechanical fall and head trauma, grossly nonfocal neurologic exam.  Patient is not anticoagulated.  Tetanus is updated and wound is cleaned and closed, counseled patient's son on wound care and return precautions.  Head CT negative.  Evaluation does not show pathology  that would require ongoing emergent intervention or inpatient treatment. Pt is hemodynamically stable and mentating appropriately. Discussed findings and plan with patient/guardian, who agrees with care plan. All questions answered. Return precautions discussed and outpatient follow up given.    Final Clinical Impressions(s) / ED Diagnoses   Final diagnoses:  Fall, initial encounter  Scalp laceration, initial encounter    ED Discharge Orders    None       Shenell Rogalski, Mardella Laymanicole, PA-C 05/23/17 1703    Arby BarrettePfeiffer, Marcy, MD 05/23/17 [phone]

## 2017-05-23 NOTE — ED Triage Notes (Signed)
Patient fall was being guided by family walking as she cant walk by herself. patient fell due to legs giving out on her causing her to fall and hit her head on on corner of wall. Patient has about inch laceration to left side of head. Denies LOC when fell.

## 2017-05-23 NOTE — ED Provider Notes (Signed)
Patient placed in Quick Look pathway, seen and evaluated   Chief Complaint: This is a 84 year old female presents with head injury.  HPI:   Patient presents with laceration to left parietal area pain after she had mechanical fall and struck the edge of the wall.  No loss of consciousness.  No emesis.  Patient is currently at her baseline.  Bleeding controlled with direct pressure.  ROS: No new deficits (one)  Physical Exam:   Gen: No distress  Neuro: Awake and Alert  Skin: Warm    Focused Exam: Approximately 3 cm laceration to left parietal region with controlled bleeding.  No pain along cervical spine.   Initiation of care has begun. The patient has been counseled on the process, plan, and necessity for staying for the completion/evaluation, and the remainder of the medical screening examination   Lorre NickAllen, Mate Alegria, MD 05/23/17 1331

## 2017-05-23 NOTE — Discharge Instructions (Signed)
Wash the affected area with soap and water and apply a thin layer of topical antibiotic ointment. Do this every 12 hours.  ° °Do not use rubbing alcohol or hydrogen peroxide.                       ° °Look for signs of infection: if you see redness, if the area becomes warm, if pain increases sharply, there is discharge (pus), if red streaks appear or you develop fever or vomiting, RETURN immediately to the Emergency Department  for a recheck.  ° °

## 2017-06-06 ENCOUNTER — Ambulatory Visit: Payer: Medicare Other | Admitting: Neurology

## 2017-06-06 ENCOUNTER — Telehealth: Payer: Self-pay | Admitting: Neurology

## 2017-06-06 NOTE — Telephone Encounter (Signed)
This patient did not show for a revisit appointment today. 

## 2017-06-07 ENCOUNTER — Encounter: Payer: Self-pay | Admitting: Neurology

## 2017-06-13 ENCOUNTER — Other Ambulatory Visit: Payer: Self-pay | Admitting: Neurology

## 2017-11-19 ENCOUNTER — Other Ambulatory Visit: Payer: Self-pay | Admitting: Neurology

## 2018-01-11 ENCOUNTER — Encounter: Payer: Self-pay | Admitting: Neurology

## 2018-01-11 ENCOUNTER — Ambulatory Visit (INDEPENDENT_AMBULATORY_CARE_PROVIDER_SITE_OTHER): Payer: Medicare Other | Admitting: Neurology

## 2018-01-11 VITALS — BP 141/85 | HR 55 | Ht 60.0 in

## 2018-01-11 DIAGNOSIS — G20 Parkinson's disease: Secondary | ICD-10-CM

## 2018-01-11 DIAGNOSIS — R404 Transient alteration of awareness: Secondary | ICD-10-CM | POA: Diagnosis not present

## 2018-01-11 MED ORDER — CARBIDOPA-LEVODOPA ER 25-100 MG PO TBCR: 1.0000 | EXTENDED_RELEASE_TABLET | Freq: Three times a day (TID) | ORAL | 1 refills | Status: DC

## 2018-01-11 NOTE — Progress Notes (Signed)
Reason for visit: Parkinson's disease  [name] is an 84 y.o. female  History of present illness:  [name] is an 84 year old right-handed Asian female with a history of Parkinson's disease associated with a gait disorder.  The patient is also developing some troubles with memory according to the family.  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  The patient has been cut back on the Stalevo taking 1 tablet twice a day, this was done a year ago.  The patient is having increasing difficulty with walking, she fell in February 2019 and sustained a laceration to the head.  The patient has a tendency to lean to the left.  Apparently she is having daily events of what sounds like stiffening and jerking of the upper extremities, it is not clear whether she is unconscious during these events.  This tends to occur in the afternoon, she is sleepy throughout the morning.  The episodes may occur throughout the night as well.  The patient returns for an evaluation.  The patient does choke when drinking water at times, she is able to eat fairly well.  Past Medical History:  Diagnosis Date  . Anxiety   . Cerebrovascular disease   . Depression   . Diabetes mellitus without complication (HCC)   . Dysphagia, pharyngoesophageal phase 08/12/2014   Dysphagia for liquids  . Gait disorder   . Hypertension   . Meralgia paresthetica    Bilateral  . Movement disorder    Parkinson's disease  . REM sleep behavior disorder   . Restless leg syndrome     Past Surgical History:  Procedure Laterality Date  . None      History reviewed. No pertinent family history.  Social history:  reports that she has never smoked. She has never used smokeless tobacco. She reports that she does not drink alcohol or use drugs.   No Known Allergies  Medications:  Prior to Admission medications   Medication Sig Start Date End Date Taking? Authorizing Provider    chlorthalidone (HYGROTON) 50 MG tablet Take 50 mg by mouth daily.   Yes [provider]  lisinopril (PRINIVIL,ZESTRIL) 2.5 MG tablet Take 2.5 mg by mouth daily.   Yes [provider]  Multiple Vitamin (MULTIVITAMIN) tablet Take 1 tablet by mouth daily.   Yes [provider]  Carbidopa-Levodopa ER (SINEMET CR) 25-100 MG tablet controlled release Take 1 tablet by mouth 3 (three) times daily. 01/11/18   York Spaniel, MD    ROS:  Out of a complete 14 system review of symptoms, the patient complains only of the following symptoms, and all other reviewed systems are negative.  Fatigue Drooling Blurred vision Constipation Daytime sleepiness Walking difficulty Speech difficulty, weakness Confusion  Blood pressure (!) 141/85, pulse (!) 55, height 5' (1.524 m), SpO2 97 %.  Physical Exam  General: The patient is alert and cooperative at the time of the examination.  Skin: No significant peripheral edema is noted.   Neurologic Exam  Mental status: The patient is alert and will cooperate with the clinical examination.  The patient does not speak [place].   Cranial nerves: Facial symmetry is present.  The patient is minimally verbal. Extraocular movements are full. Visual fields are full.  Masking of the face is seen.  Motor: The patient has good strength in all 4 extremities.  Sensory examination: Soft touch sensation is symmetric on the face, arms, and legs.  Coordination:  The patient has good finger-nose-finger and heel-to-shin bilaterally.  Gait and station: The patient requires some assistance with standing, once up, the patient continues to lean to the left.  She can take a few steps with assistance, she tends to freeze with trying to walk.  Reflexes: Deep tendon reflexes are symmetric.   Assessment/Plan:  1.  Parkinson's disease  2.  Episodic stiffening and jerking events, etiology unclear  3.  Gait disorder  The patient is having  frequent events of stiffening and jerking of the arms, the etiology is not clear, the patient will undergo an EEG study.  The family will try to videotape the events for me.  These events do not appear to be typical for dyskinesias associated with Parkinson's disease.  The patient may be developing a dementia associated with the Parkinson's.  She clearly has a significant gait problem and is at risk for falls.  Currently, the family has assistance coming into the house to help her out.  We will take her off of Stalevo, we will switch her to Sinemet taking the 25/100 mg CR tablet, 1 tablet 3 times daily.  Marlan Palau MD 01/11/2018 10:55 AM  [facility] [address] [place], [place] [address]  Phone [phone] Fax [phone]

## 2018-01-11 NOTE — Patient Instructions (Signed)
Stop the stalevo, we will switch to Sinemet CR 25/100 mg one three times a day.   Sinemet (carbidopa) may result in confusion or hallucinations, drowsiness, nausea, or dizziness. If any significant side effects are noted, please contact our office. Sinemet may not be well absorbed when taken with high protein meals, if tolerated it is best to take 30-45 minutes before you eat.

## 2018-01-25 ENCOUNTER — Ambulatory Visit (INDEPENDENT_AMBULATORY_CARE_PROVIDER_SITE_OTHER): Payer: Medicare Other | Admitting: Neurology

## 2018-01-25 ENCOUNTER — Telehealth: Payer: Self-pay | Admitting: Neurology

## 2018-01-25 DIAGNOSIS — R404 Transient alteration of awareness: Secondary | ICD-10-CM

## 2018-01-25 DIAGNOSIS — R251 Tremor, unspecified: Secondary | ICD-10-CM | POA: Diagnosis not present

## 2018-01-25 NOTE — Procedures (Signed)
     History: [name] is an 84 year old Asian female with a history of Parkinson's disease and a gait disorder.  The patient has had episodes of tremors of the arms and moaning that occurs on a daily basis.  The patient oftentimes is not fully conscious during these events.  The patient is being evaluated for these episodes.  This is a routine EEG.  No skull defects are noted.  Medications include chlorthalidone, lisinopril, multivitamins, and Sinemet.  EEG classification: Dysrhythmia grade 1 generalized  Description of the recording: The background rhythms of this recording consists of a fairly well-modulated medium amplitude 7 Hz theta frequency activity that is reactive to eye opening and closure.  As the record progresses, the patient appears to drift in and out of the drowsy state throughout the recording but never clearly enters stage II sleep.  Photic stimulation is performed, this does result in a bilateral symmetric photic driving response.  Hyperventilation was not performed.  Intermittently during the study, the patient develops tremors clinically associated with some movement artifact seen on the EKG leads, clinically the patient is moaning, this seems to correlate with episodes of drowsiness during the study.  At no time does there appear to be evidence of spike or spike-wave discharges or evidence of focal slowing.  EKG monitor shows no evidence of cardiac rhythm abnormalities with a heart rate of 54.  Impression: This is an abnormal EEG recording secondary to diffuse background theta slowing.  This is a nonspecific recording and can be seen with any process that results in a mild toxic or metabolic encephalopathy or any dementing illness.  The episodes of tremors and moaning appear to correlate with drowsiness, no epileptiform discharges were seen.

## 2018-01-25 NOTE — Telephone Encounter (Signed)
I called the patient.  The EEG study was unremarkable, shows mild diffuse slowing likely associated with her dementia.  The patient was drowsy off and on during the recording.  The patient did have episodes of tremor and moaning, this correlated with episodes of drowsiness, no evidence of seizures.  The family may try giving her Benadryl 25 mg at night, possibly given her half a tablet during the day to see if he can come down the tremors.  The patient will moan and tremor off and on throughout the day.

## 2018-02-07 ENCOUNTER — Telehealth: Payer: Self-pay | Admitting: Neurology

## 2018-02-07 MED ORDER — CARBIDOPA-LEVODOPA-ENTACAPONE 25-100-200 MG PO TABS: 1.0000 | ORAL_TABLET | Freq: Three times a day (TID) | ORAL | 5 refills | Status: AC

## 2018-02-07 NOTE — Telephone Encounter (Signed)
I spoke with pt's dtr. who confirmed that pt. is taking Sinemet as rx'd/fim

## 2018-02-07 NOTE — Telephone Encounter (Signed)
Pts daughter Drema Halon returning RNs call can be reached at [phone]

## 2018-02-07 NOTE — Telephone Encounter (Signed)
Pt son(on DPR-Tran,Hoang) has called to inform that pt has become very week within the last 2 weeks unable to move legs to try and walk.  Son feels recent change in medication may be too much, he is asking for a call to discuss

## 2018-02-07 NOTE — Addendum Note (Signed)
Addended by: York Spaniel on: 02/07/2018 04:19 PM   Modules accepted: Orders

## 2018-02-07 NOTE — Telephone Encounter (Signed)
I called and talk with the family.  The patient has been getting some Benadryl at night for the tremors, this seems to help her sleep better, but she is not able to walk seems to be frozen more, she has been switched off of Stalevo, we will go back on Stalevo now, hold off on Benadryl for now until she has back to her baseline.  They may add the Benadryl in the future.  A prescription was sent in for the Stalevo.

## 2018-02-07 NOTE — Telephone Encounter (Signed)
Spoke with son. He is not able to confirm what medications pt. is taking. Sts. his sister can confirm this. He will have her call our office/fim

## 2018-03-07 ENCOUNTER — Emergency Department (HOSPITAL_BASED_OUTPATIENT_CLINIC_OR_DEPARTMENT_OTHER): Payer: Medicare Other

## 2018-03-07 ENCOUNTER — Inpatient Hospital Stay (HOSPITAL_BASED_OUTPATIENT_CLINIC_OR_DEPARTMENT_OTHER)
Admission: EM | Admit: 2018-03-07 | Discharge: 2018-03-14 | DRG: 071 | Disposition: A | Discharge status: Hospice | Payer: Medicare Other | Attending: Internal Medicine | Admitting: Internal Medicine

## 2018-03-07 ENCOUNTER — Other Ambulatory Visit: Payer: Self-pay

## 2018-03-07 ENCOUNTER — Encounter (HOSPITAL_BASED_OUTPATIENT_CLINIC_OR_DEPARTMENT_OTHER): Payer: Self-pay

## 2018-03-07 DIAGNOSIS — E876 Hypokalemia: Secondary | ICD-10-CM | POA: Diagnosis present

## 2018-03-07 DIAGNOSIS — G2581 Restless legs syndrome: Secondary | ICD-10-CM | POA: Diagnosis present

## 2018-03-07 DIAGNOSIS — G9341 Metabolic encephalopathy: Principal | ICD-10-CM | POA: Diagnosis present

## 2018-03-07 DIAGNOSIS — D638 Anemia in other chronic diseases classified elsewhere: Secondary | ICD-10-CM | POA: Diagnosis present

## 2018-03-07 DIAGNOSIS — R1314 Dysphagia, pharyngoesophageal phase: Secondary | ICD-10-CM | POA: Diagnosis present

## 2018-03-07 DIAGNOSIS — R400 Somnolence: Secondary | ICD-10-CM

## 2018-03-07 DIAGNOSIS — R001 Bradycardia, unspecified: Secondary | ICD-10-CM | POA: Diagnosis not present

## 2018-03-07 DIAGNOSIS — E440 Moderate protein-calorie malnutrition: Secondary | ICD-10-CM

## 2018-03-07 DIAGNOSIS — E119 Type 2 diabetes mellitus without complications: Secondary | ICD-10-CM | POA: Diagnosis present

## 2018-03-07 DIAGNOSIS — Z6822 Body mass index (BMI) 22.0-22.9, adult: Secondary | ICD-10-CM

## 2018-03-07 DIAGNOSIS — E871 Hypo-osmolality and hyponatremia: Secondary | ICD-10-CM | POA: Diagnosis not present

## 2018-03-07 DIAGNOSIS — Z8673 Personal history of transient ischemic attack (TIA), and cerebral infarction without residual deficits: Secondary | ICD-10-CM

## 2018-03-07 DIAGNOSIS — Z515 Encounter for palliative care: Secondary | ICD-10-CM | POA: Diagnosis not present

## 2018-03-07 DIAGNOSIS — F05 Delirium due to known physiological condition: Secondary | ICD-10-CM | POA: Diagnosis not present

## 2018-03-07 DIAGNOSIS — E870 Hyperosmolality and hypernatremia: Secondary | ICD-10-CM

## 2018-03-07 DIAGNOSIS — G20 Parkinson's disease: Secondary | ICD-10-CM | POA: Diagnosis present

## 2018-03-07 DIAGNOSIS — E860 Dehydration: Secondary | ICD-10-CM | POA: Diagnosis present

## 2018-03-07 DIAGNOSIS — K5900 Constipation, unspecified: Secondary | ICD-10-CM | POA: Diagnosis present

## 2018-03-07 DIAGNOSIS — Z66 Do not resuscitate: Secondary | ICD-10-CM | POA: Diagnosis not present

## 2018-03-07 DIAGNOSIS — F0280 Dementia in other diseases classified elsewhere without behavioral disturbance: Secondary | ICD-10-CM

## 2018-03-07 DIAGNOSIS — G9340 Encephalopathy, unspecified: Secondary | ICD-10-CM | POA: Diagnosis present

## 2018-03-07 DIAGNOSIS — I10 Essential (primary) hypertension: Secondary | ICD-10-CM

## 2018-03-07 DIAGNOSIS — Z79899 Other long term (current) drug therapy: Secondary | ICD-10-CM

## 2018-03-07 LAB — CBC WITH DIFFERENTIAL/PLATELET
Abs Immature Granulocytes: 0.02 10*3/uL (ref 0.00–0.07)
BASOS ABS: 0.1 10*3/uL (ref 0.0–0.1)
Basophils Relative: 1 %
EOS ABS: 0 10*3/uL (ref 0.0–0.5)
Eosinophils Relative: 0 %
HCT: 33.8 % — ABNORMAL LOW (ref 36.0–46.0)
Hemoglobin: 10.8 g/dL — ABNORMAL LOW (ref 12.0–15.0)
IMMATURE GRANULOCYTES: 0 %
Lymphocytes Relative: 19 %
Lymphs Abs: 1.7 10*3/uL (ref 0.7–4.0)
MCH: 32.1 pg (ref 26.0–34.0)
MCHC: 32 g/dL (ref 30.0–36.0)
MCV: 100.6 fL — ABNORMAL HIGH (ref 80.0–100.0)
Monocytes Absolute: 0.6 10*3/uL (ref 0.1–1.0)
Monocytes Relative: 7 %
NEUTROS ABS: 6.6 10*3/uL (ref 1.7–7.7)
NEUTROS PCT: 73 %
NRBC: 0 % (ref 0.0–0.2)
PLATELETS: 193 10*3/uL (ref 150–400)
RBC: 3.36 MIL/uL — AB (ref 3.87–5.11)
RDW: 12.8 % (ref 11.5–15.5)
WBC: 9 10*3/uL (ref 4.0–10.5)

## 2018-03-07 LAB — URINALYSIS, ROUTINE W REFLEX MICROSCOPIC
BILIRUBIN URINE: NEGATIVE
Glucose, UA: NEGATIVE mg/dL
HGB URINE DIPSTICK: NEGATIVE
KETONES UR: NEGATIVE mg/dL
Leukocytes, UA: NEGATIVE
Nitrite: NEGATIVE
PROTEIN: 30 mg/dL — AB
SPECIFIC GRAVITY, URINE: 1.025 (ref 1.005–1.030)
pH: 5.5 (ref 5.0–8.0)

## 2018-03-07 LAB — COMPREHENSIVE METABOLIC PANEL
ALK PHOS: 45 U/L (ref 38–126)
ALT: 21 U/L (ref 0–44)
ANION GAP: 11 (ref 5–15)
AST: 24 U/L (ref 15–41)
Albumin: 4.1 g/dL (ref 3.5–5.0)
BILIRUBIN TOTAL: 0.9 mg/dL (ref 0.3–1.2)
BUN: 37 mg/dL — ABNORMAL HIGH (ref 8–23)
CALCIUM: 11.6 mg/dL — AB (ref 8.9–10.3)
CO2: 22 mmol/L (ref 22–32)
Chloride: 116 mmol/L — ABNORMAL HIGH (ref 98–111)
Creatinine, Ser: 1.25 mg/dL — ABNORMAL HIGH (ref 0.44–1.00)
GFR calc non Af Amer: 38 mL/min — ABNORMAL LOW (ref >60)
GFR, EST AFRICAN AMERICAN: 44 mL/min — AB (ref >60)
Glucose, Bld: 151 mg/dL — ABNORMAL HIGH (ref 70–99)
POTASSIUM: 3.9 mmol/L (ref 3.5–5.1)
SODIUM: 149 mmol/L — AB (ref 135–145)
TOTAL PROTEIN: 7.2 g/dL (ref 6.5–8.1)

## 2018-03-07 LAB — AMMONIA: Ammonia: 15 umol/L (ref 9–35)

## 2018-03-07 LAB — TROPONIN I: TROPONIN I: 0.03 ng/mL — AB (ref <0.03)

## 2018-03-07 LAB — URINALYSIS, MICROSCOPIC (REFLEX): WBC, UA: NONE SEEN WBC/hpf (ref 0–5)

## 2018-03-07 MED ORDER — LACTATED RINGERS IV SOLN
INTRAVENOUS | Status: DC
  Administered 2018-03-07 – 2018-03-08 (×2): via INTRAVENOUS

## 2018-03-07 MED ORDER — SODIUM CHLORIDE 0.9 % IV BOLUS
500.0000 mL | Freq: Once | INTRAVENOUS | Status: AC
  Administered 2018-03-07: 500 mL via INTRAVENOUS

## 2018-03-07 NOTE — ED Notes (Signed)
Patient transported to CT 

## 2018-03-07 NOTE — ED Notes (Signed)
Date and time results received: 03/07/18 1956 (use smartphrase ".now" to insert current time)  Test: troponin Critical Value: 0.03  Name of Provider Notified: Dr. Jacqulyn BathLong  Orders Received? Or Actions Taken?: no new orders

## 2018-03-07 NOTE — ED Triage Notes (Signed)
Pt arrives via [facility] from home with a complaint of AMS progressing over the last week. Per EMS from family, pt has refused her medication for the last three days and has spit it out. Pt is not verbal, family takes care of her and states that her urine output has been the same and not odorous, state that normally she uses a walker to get around and that they have had to carry her around.

## 2018-03-07 NOTE — ED Provider Notes (Addendum)
Emergency Department Provider Note   I have reviewed the triage vital signs and the nursing notes.   HISTORY  Chief Complaint Altered Mental Status   HPI [name] is a 84 y.o. female with PMH of parkinson's disease presents to the emergency department with acutely worsening somnolence.  Family state that she has had progressive decline over the past week but over the past 2 days is stopped eating and drinking.  She has not taken any medications over that time.  Family has been trying to eat her soup and fluids through a straw but she will not drink.  They have noticed episodes and they are trying to do this where she begins to have choking and coughing.  They deny noticing any fevers or chills.  She will make eye contact with family but seems very fatigued and weak.  They are having to carry her around the house.  They note that the patient has had changes in her Parkinson's medications but when those changes did not work 2 weeks ago she was switched back to her old medication regimen.   Level 5 caveat: AMS   Past Medical History:  Diagnosis Date  . Anxiety   . Cerebrovascular disease   . Depression   . Diabetes mellitus without complication (HCC)   . Dysphagia, pharyngoesophageal phase 08/12/2014   Dysphagia for liquids  . Gait disorder   . Hypertension   . Meralgia paresthetica    Bilateral  . Movement disorder    Parkinson's disease  . REM sleep behavior disorder   . Restless leg syndrome     Patient Active Problem List   Diagnosis Date Noted  . Acute encephalopathy 03/07/2018  . Dysphagia, pharyngoesophageal phase 08/12/2014  . Meralgia paresthetica 07/10/2012  . REM sleep behavior disorder 07/10/2012  . Abnormality of gait 07/10/2012  . Paralysis agitans (HCC) 07/10/2012    Past Surgical History:  Procedure Laterality Date  . None      Allergies Patient has no known allergies.  No family history on file.  Social History Social History   Tobacco  Use  . Smoking status: Never Smoker  . Smokeless tobacco: Never Used  Substance Use Topics  . Alcohol use: No    Alcohol/week: 0.0 standard drinks  . Drug use: No    Review of Systems  Level 5 caveat: AMS ____________________________________________   PHYSICAL EXAM:  VITAL SIGNS: Vitals:   03/07/18 2230 03/07/18 2300  BP: (!) 188/67 (!) 167/79  Pulse: (!) 53 (!) 49  Resp: 17 20  Temp:    SpO2: 98% 97%    Constitutional: Somnolent. Will open eyes to loud verbal command but not speaking. Increased tone.  Eyes: Conjunctivae are normal. PERRL.  Head: Atraumatic. Nose: No congestion/rhinnorhea. Mouth/Throat: Mucous membranes are very dry. Protecting airway.  Neck: No stridor.  Cardiovascular: Bradycardia. Good peripheral circulation. Grossly normal heart sounds.   Respiratory: Normal respiratory effort.  No retractions. Lungs CTAB. Gastrointestinal: Soft and nontender. No distention.  Musculoskeletal: No lower extremity tenderness nor edema. No gross deformities of extremities.  Neurologic: Somnolent. Not verbally responsive. Increased tone without seizure activity. Not participating with Neuro exam.  Skin:  Skin is warm, dry and intact. No rash noted. No sores or ulcerations.   ____________________________________________   LABS (all labs ordered are listed, but only abnormal results are displayed)  Labs Reviewed  COMPREHENSIVE METABOLIC PANEL - Abnormal; Notable for the following components:      Result Value   Sodium 149 (*)  Chloride 116 (*)    Glucose, Bld 151 (*)    BUN 37 (*)    Creatinine, Ser 1.25 (*)    Calcium 11.6 (*)    GFR calc non Af Amer 38 (*)    GFR calc Af Amer 44 (*)    All other components within normal limits  CBC WITH DIFFERENTIAL/PLATELET - Abnormal; Notable for the following components:   RBC 3.36 (*)    Hemoglobin 10.8 (*)    HCT 33.8 (*)    MCV 100.6 (*)    All other components within normal limits  TROPONIN I - Abnormal; Notable  for the following components:   Troponin I 0.03 (*)    All other components within normal limits  URINALYSIS, ROUTINE W REFLEX MICROSCOPIC - Abnormal; Notable for the following components:   Protein, ur 30 (*)    All other components within normal limits  URINALYSIS, MICROSCOPIC (REFLEX) - Abnormal; Notable for the following components:   Bacteria, UA RARE (*)    All other components within normal limits  URINE CULTURE  AMMONIA   ____________________________________________  EKG   EKG Interpretation  Date/Time:  Tuesday March 07 2018 18:38:39 EST Ventricular Rate:  77 PR Interval:    QRS Duration: 90 QT Interval:  349 QTC Calculation: 395 R Axis:   21 Text Interpretation:  Sinus rhythm Anteroseptal infarct, age indeterminate No STEMI.  Confirmed by Alona BeneLong, Joshua [id number]) on 03/07/2018 11:52:08 PM       ____________________________________________  RADIOLOGY  Dg Chest 2 View  Result Date: 03/07/2018 CLINICAL DATA:  Acute mental status change. EXAM: CHEST - 2 VIEW COMPARISON:  July 18, 2007 FINDINGS: Compression fracture midthoracic vertebral body, age indeterminate but new since 2009. The heart, hila, mediastinum, lungs, and pleura are otherwise unremarkable. IMPRESSION: Compression fracture of a midthoracic vertebral body, new since 2009 but otherwise age indeterminate. Recommend clinical correlation. No other acute abnormalities. Electronically Signed   By: Gerome Samavid  Williams III M.D   On: 03/07/2018 20:57   Ct Head Wo Contrast  Result Date: 03/07/2018 CLINICAL DATA:  Progressing altered mental status over the last week. EXAM: CT HEAD WITHOUT CONTRAST TECHNIQUE: Contiguous axial images were obtained from the base of the skull through the vertex without intravenous contrast. COMPARISON:  05/23/2017 FINDINGS: Brain: No evidence of acute infarction, hemorrhage, hydrocephalus, extra-axial collection or mass lesion/mass effect. Mild cerebral atrophy. Vascular: Moderate  intracranial arterial vascular calcifications. Skull: Calvarium appears intact. Sinuses/Orbits: Paranasal sinuses and mastoid air cells are clear. Other: None. IMPRESSION: No acute intracranial abnormality.  Mild cerebral atrophy. Electronically Signed   By: Burman NievesWilliam  Stevens M.D.   On: 03/07/2018 21:53    ____________________________________________   PROCEDURES  Procedure(s) performed:   Procedures  CRITICAL CARE Performed by: Maia PlanJoshua G Long Total critical care time: 35 minutes Critical care time was exclusive of separately billable procedures and treating other patients. Critical care was necessary to treat or prevent imminent or life-threatening deterioration. Critical care was time spent personally by me on the following activities: development of treatment plan with patient and/or surrogate as well as nursing, discussions with consultants, evaluation of patient's response to treatment, examination of patient, obtaining history from patient or surrogate, ordering and performing treatments and interventions, ordering and review of laboratory studies, ordering and review of radiographic studies, pulse oximetry and re-evaluation of patient's condition.  Alona BeneJoshua Long, MD Emergency Medicine  ____________________________________________   INITIAL IMPRESSION / ASSESSMENT AND PLAN / ED COURSE  Pertinent labs & imaging results that were available during  my care of the patient were reviewed by me and considered in my medical decision making (see chart for details).  Patient presents to the emergency department for evaluation of acute mental status decline over the past 4 days.  No medications able to be given over the past 2 days.  Afebrile here.  Patient is more globally encephalopathic.  Neuro exam is limited initially.  No focal deficits.  Patient does have increased tone likely secondary to not being able to take her Parkinson's medications over the past 2 days.  Chest x-ray along with CT  imaging reviewed with no acute findings.  Patient does have elevated sodium and chloride correlate well with significant dehydration.  No aspiration pneumonia on chest x-ray.  She given 500 mL bolus of IV fluids here followed by LR infusion.  Mental status is improving here with IV fluids.  Patient is saying 1-2 word phrases to family.  Denies pain.  Opponent is mildly elevated but no acute EKG changes.   Discussed patient's case with Hospitalist, Dr. Antionette Char to request admission. Patient and family (if present) updated with plan. Care transferred to Hospitalist service.  I reviewed all nursing notes, vitals, pertinent old records, EKGs, labs, imaging (as available).  ____________________________________________  FINAL CLINICAL IMPRESSION(S) / ED DIAGNOSES  Final diagnoses:  Somnolence  Severe dehydration  Hypernatremia    MEDICATIONS GIVEN DURING THIS VISIT:  Medications  lactated ringers infusion ( Intravenous New Bag/Given 03/07/18 2209)  sodium chloride 0.9 % bolus 500 mL ( Intravenous Stopped 03/07/18 1927)    Note:  This document was prepared using Dragon voice recognition software and may include unintentional dictation errors.  Alona Bene, MD Emergency Medicine    Long, Arlyss Repress, MD 03/07/18 Phineas Douglas    Maia Plan, MD 03/13/18 1331

## 2018-03-08 ENCOUNTER — Encounter (HOSPITAL_COMMUNITY): Payer: Self-pay | Admitting: *Deleted

## 2018-03-08 ENCOUNTER — Observation Stay (HOSPITAL_COMMUNITY): Payer: Medicare Other

## 2018-03-08 DIAGNOSIS — G9340 Encephalopathy, unspecified: Secondary | ICD-10-CM

## 2018-03-08 DIAGNOSIS — R001 Bradycardia, unspecified: Secondary | ICD-10-CM | POA: Diagnosis not present

## 2018-03-08 DIAGNOSIS — E870 Hyperosmolality and hypernatremia: Secondary | ICD-10-CM

## 2018-03-08 DIAGNOSIS — I10 Essential (primary) hypertension: Secondary | ICD-10-CM | POA: Diagnosis not present

## 2018-03-08 DIAGNOSIS — E119 Type 2 diabetes mellitus without complications: Secondary | ICD-10-CM | POA: Diagnosis not present

## 2018-03-08 DIAGNOSIS — F0280 Dementia in other diseases classified elsewhere without behavioral disturbance: Secondary | ICD-10-CM | POA: Diagnosis not present

## 2018-03-08 DIAGNOSIS — G9341 Metabolic encephalopathy: Secondary | ICD-10-CM | POA: Diagnosis not present

## 2018-03-08 DIAGNOSIS — E860 Dehydration: Secondary | ICD-10-CM | POA: Diagnosis not present

## 2018-03-08 DIAGNOSIS — Z515 Encounter for palliative care: Secondary | ICD-10-CM | POA: Diagnosis not present

## 2018-03-08 DIAGNOSIS — Z6822 Body mass index (BMI) 22.0-22.9, adult: Secondary | ICD-10-CM | POA: Diagnosis not present

## 2018-03-08 DIAGNOSIS — E440 Moderate protein-calorie malnutrition: Secondary | ICD-10-CM | POA: Diagnosis not present

## 2018-03-08 DIAGNOSIS — Z66 Do not resuscitate: Secondary | ICD-10-CM | POA: Diagnosis not present

## 2018-03-08 DIAGNOSIS — G20 Parkinson's disease: Secondary | ICD-10-CM

## 2018-03-08 DIAGNOSIS — Z79899 Other long term (current) drug therapy: Secondary | ICD-10-CM | POA: Diagnosis not present

## 2018-03-08 DIAGNOSIS — K5900 Constipation, unspecified: Secondary | ICD-10-CM | POA: Diagnosis not present

## 2018-03-08 DIAGNOSIS — E871 Hypo-osmolality and hyponatremia: Secondary | ICD-10-CM | POA: Diagnosis not present

## 2018-03-08 DIAGNOSIS — E876 Hypokalemia: Secondary | ICD-10-CM | POA: Diagnosis not present

## 2018-03-08 DIAGNOSIS — D638 Anemia in other chronic diseases classified elsewhere: Secondary | ICD-10-CM | POA: Diagnosis not present

## 2018-03-08 DIAGNOSIS — R400 Somnolence: Secondary | ICD-10-CM | POA: Diagnosis present

## 2018-03-08 DIAGNOSIS — Z8673 Personal history of transient ischemic attack (TIA), and cerebral infarction without residual deficits: Secondary | ICD-10-CM | POA: Diagnosis not present

## 2018-03-08 DIAGNOSIS — R1314 Dysphagia, pharyngoesophageal phase: Secondary | ICD-10-CM | POA: Diagnosis not present

## 2018-03-08 DIAGNOSIS — F05 Delirium due to known physiological condition: Secondary | ICD-10-CM | POA: Diagnosis not present

## 2018-03-08 DIAGNOSIS — G2581 Restless legs syndrome: Secondary | ICD-10-CM | POA: Diagnosis not present

## 2018-03-08 LAB — CBC
HEMATOCRIT: 33.4 % — AB (ref 36.0–46.0)
Hemoglobin: 10.7 g/dL — ABNORMAL LOW (ref 12.0–15.0)
MCH: 31.8 pg (ref 26.0–34.0)
MCHC: 32 g/dL (ref 30.0–36.0)
MCV: 99.4 fL (ref 80.0–100.0)
NRBC: 0 % (ref 0.0–0.2)
Platelets: 190 10*3/uL (ref 150–400)
RBC: 3.36 MIL/uL — ABNORMAL LOW (ref 3.87–5.11)
RDW: 12.8 % (ref 11.5–15.5)
WBC: 9.2 10*3/uL (ref 4.0–10.5)

## 2018-03-08 LAB — BASIC METABOLIC PANEL
ANION GAP: 7 (ref 5–15)
BUN: 31 mg/dL — ABNORMAL HIGH (ref 8–23)
CALCIUM: 11.1 mg/dL — AB (ref 8.9–10.3)
CO2: 24 mmol/L (ref 22–32)
Chloride: 120 mmol/L — ABNORMAL HIGH (ref 98–111)
Creatinine, Ser: 1.06 mg/dL — ABNORMAL HIGH (ref 0.44–1.00)
GFR calc Af Amer: 54 mL/min — ABNORMAL LOW (ref >60)
GFR, EST NON AFRICAN AMERICAN: 46 mL/min — AB (ref >60)
GLUCOSE: 111 mg/dL — AB (ref 70–99)
Potassium: 4 mmol/L (ref 3.5–5.1)
Sodium: 151 mmol/L — ABNORMAL HIGH (ref 135–145)

## 2018-03-08 LAB — BLOOD GAS, ARTERIAL
ACID-BASE EXCESS: 1.6 mmol/L (ref 0.0–2.0)
BICARBONATE: 23.6 mmol/L (ref 20.0–28.0)
Drawn by: 257881
O2 Saturation: 97.8 %
PATIENT TEMPERATURE: 98.6
PO2 ART: 96.4 mmHg (ref 83.0–108.0)
pCO2 arterial: 28.9 mmHg — ABNORMAL LOW (ref 32.0–48.0)
pH, Arterial: 7.522 — ABNORMAL HIGH (ref 7.350–7.450)

## 2018-03-08 LAB — CBG MONITORING, ED: GLUCOSE-CAPILLARY: 99 mg/dL (ref 70–99)

## 2018-03-08 LAB — GLUCOSE, CAPILLARY: Glucose-Capillary: 96 mg/dL (ref 70–99)

## 2018-03-08 LAB — URINALYSIS, ROUTINE W REFLEX MICROSCOPIC
BILIRUBIN URINE: NEGATIVE
Glucose, UA: NEGATIVE mg/dL
Hgb urine dipstick: NEGATIVE
Ketones, ur: 15 mg/dL — AB
Leukocytes, UA: NEGATIVE
NITRITE: NEGATIVE
Protein, ur: NEGATIVE mg/dL
SPECIFIC GRAVITY, URINE: 1.02 (ref 1.005–1.030)
pH: 6 (ref 5.0–8.0)

## 2018-03-08 MED ORDER — ONDANSETRON HCL 4 MG/2ML IJ SOLN: 4.0000 mg | Freq: Four times a day (QID) | INTRAMUSCULAR | Status: DC | PRN

## 2018-03-08 MED ORDER — HYDRALAZINE HCL 20 MG/ML IJ SOLN
20.0000 mg | Freq: Four times a day (QID) | INTRAMUSCULAR | Status: DC | PRN
  Administered 2018-03-08: 20 mg via INTRAVENOUS
  Filled 2018-03-08: qty 1

## 2018-03-08 MED ORDER — HYDRALAZINE HCL 20 MG/ML IJ SOLN
5.0000 mg | Freq: Once | INTRAMUSCULAR | Status: AC
  Administered 2018-03-08: 5 mg via INTRAVENOUS
  Filled 2018-03-08: qty 1

## 2018-03-08 MED ORDER — DEXTROSE-NACL 5-0.45 % IV SOLN: INTRAVENOUS | Status: DC

## 2018-03-08 MED ORDER — BISACODYL 10 MG RE SUPP: 10.0000 mg | Freq: Every day | RECTAL | Status: DC | PRN

## 2018-03-08 MED ORDER — HYDRALAZINE HCL 20 MG/ML IJ SOLN
10.0000 mg | Freq: Four times a day (QID) | INTRAMUSCULAR | Status: DC | PRN
  Administered 2018-03-14: 10 mg via INTRAVENOUS
  Filled 2018-03-08 (×2): qty 1

## 2018-03-08 MED ORDER — ACETAMINOPHEN 10 MG/ML IV SOLN
1000.0000 mg | Freq: Four times a day (QID) | INTRAVENOUS | Status: AC | PRN
  Administered 2018-03-08 (×2): 1000 mg via INTRAVENOUS
  Filled 2018-03-08 (×2): qty 100

## 2018-03-08 MED ORDER — ENOXAPARIN SODIUM 30 MG/0.3ML ~~LOC~~ SOLN
30.0000 mg | SUBCUTANEOUS | Status: DC
  Administered 2018-03-08 – 2018-03-14 (×7): 30 mg via SUBCUTANEOUS
  Filled 2018-03-08 (×7): qty 0.3

## 2018-03-08 MED ORDER — SODIUM CHLORIDE 0.9 % IV BOLUS
250.0000 mL | Freq: Once | INTRAVENOUS | Status: AC
  Administered 2018-03-08: 250 mL via INTRAVENOUS

## 2018-03-08 MED ORDER — SENNOSIDES-DOCUSATE SODIUM 8.6-50 MG PO TABS: 1.0000 | ORAL_TABLET | Freq: Every evening | ORAL | Status: DC | PRN

## 2018-03-08 MED ORDER — DEXTROSE 5 % IV SOLN
INTRAVENOUS | Status: DC
  Administered 2018-03-08 – 2018-03-13 (×11): via INTRAVENOUS

## 2018-03-08 MED ORDER — ONDANSETRON HCL 4 MG PO TABS: 4.0000 mg | ORAL_TABLET | Freq: Four times a day (QID) | ORAL | Status: DC | PRN

## 2018-03-08 NOTE — ED Notes (Signed)
Informed Dr Adela LankFloyd of current condition:  Foul smelling urine with fever.  Orders recd.

## 2018-03-08 NOTE — ED Notes (Signed)
No orders have been placed for pt for admission. Call placed to hospitalist by unit secretary.

## 2018-03-08 NOTE — H&P (Signed)
History and Physical  [name] ZOX:[id number] DOB: [birth year] DOA: 03/07/2018  PCP: Iona Hansen, NP Patient coming from: Home   I have personally briefly reviewed patient's old medical records in [facility]   Chief Complaint: AMS.   HPI: [name] is a 84 y.o. female with PMH significant for CVD, DM, parkinson's diseases who at baseline is able to speak few words per family. Patient over last 4 days, has not been eating. She is more lethargic. Per son, patient use to be able to eat and takes  her medications, lately she has not been able to do that. She is not swallowing her pills.   Patient on my evaluation is lethargic, moaning as if she is in pain. She keeps eyes close. Patient is not able to provide history.  Of note patient last BM was 4 days ago.    Review of Systems: Unable to obtain due to AMS.   Past Medical History:  Diagnosis Date  . Anxiety   . Cerebrovascular disease   . Depression   . Diabetes mellitus without complication (HCC)   . Dysphagia, pharyngoesophageal phase 08/12/2014   Dysphagia for liquids  . Gait disorder   . Hypertension   . Meralgia paresthetica    Bilateral  . Movement disorder    Parkinson's disease  . REM sleep behavior disorder   . Restless leg syndrome    Past Surgical History:  Procedure Laterality Date  . None     Social History:  reports that she has never smoked. She has never used smokeless tobacco. She reports that she does not drink alcohol or use drugs.   No Known Allergies  No family history on file. unable to obtain from patient.   Prior to Admission medications   Medication Sig Start Date End Date Taking? Authorizing Provider  carbidopa-levodopa-entacapone (STALEVO 100) 25-100-200 MG tablet Take 1 tablet by mouth 3 (three) times daily. 02/07/18   York Spaniel, MD  chlorthalidone (HYGROTON) 50 MG tablet Take 50 mg by mouth daily.    [provider]  lisinopril (PRINIVIL,ZESTRIL) 2.5 MG  tablet Take 2.5 mg by mouth daily.    [provider]  Multiple Vitamin (MULTIVITAMIN) tablet Take 1 tablet by mouth daily.    [provider]   Physical Exam: Vitals:   03/08/18 1300 03/08/18 1330 03/08/18 1518 03/08/18 1529  BP: (!) 179/47 (!) 180/66  (!) 184/79  Pulse: 62 66  80  Resp: 11 14    Temp:    99 F (37.2 C)  TempSrc:    Oral  SpO2: 99% 99%  99%  Weight:   45.7 kg   Height:   4\' 7"  (1.397 m)      General exam: lethargic, moaning, lying down in bed in fetal position.   Head, dry.   Neck: Supple. No JVD, carotid bruit or thyromegaly.  Lymphatics: No lymphadenopathy.  Respiratory system: Clear to auscultation. No increased work of breathing.  Cardiovascular system: S1 and S2 heard, RRR. No JVD, murmurs, gallops, clicks or pedal edema.  Gastrointestinal system: Abdomen is nondistended, soft and nontender. Normal bowel sounds heard. No organomegaly or masses appreciated.  Central nervous system: lethargic, not following command.   Extremities: no edema. Peripheral pulses symmetrically felt.   Skin; dry skin.   Musculoskeletal system: Negative exam.  Psychiatry: unable to assess.    Labs on Admission:  Basic Metabolic Panel: Recent Labs  Lab 03/07/18 1852  NA 149*  K  3.9  CL 116*  CO2 22  GLUCOSE 151*  BUN 37*  CREATININE 1.25*  CALCIUM 11.6*   Liver Function Tests: Recent Labs  Lab 03/07/18 1852  AST 24  ALT 21  ALKPHOS 45  BILITOT 0.9  PROT 7.2  ALBUMIN 4.1   No results for input(s): LIPASE, AMYLASE in the last 168 hours. Recent Labs  Lab 03/07/18 1852  AMMONIA 15   CBC: Recent Labs  Lab 03/07/18 1852 03/08/18 1526  WBC 9.0 9.2  NEUTROABS 6.6  --   HGB 10.8* 10.7*  HCT 33.8* 33.4*  MCV 100.6* 99.4  PLT 193 190   Cardiac Enzymes: Recent Labs  Lab 03/07/18 1852  TROPONINI 0.03*    BNP (last 3 results) No results for input(s): PROBNP in the last 8760 hours. CBG: Recent Labs  Lab 03/08/18 1349  03/08/18 1529  GLUCAP 99 96    Radiological Exams on Admission: Dg Chest 2 View  Result Date: 03/07/2018 CLINICAL DATA:  Acute mental status change. EXAM: CHEST - 2 VIEW COMPARISON:  July 18, 2007 FINDINGS: Compression fracture midthoracic vertebral body, age indeterminate but new since 2009. The heart, hila, mediastinum, lungs, and pleura are otherwise unremarkable. IMPRESSION: Compression fracture of a midthoracic vertebral body, new since 2009 but otherwise age indeterminate. Recommend clinical correlation. No other acute abnormalities. Electronically Signed   By: Gerome Sam III M.D   On: 03/07/2018 20:57   Ct Head Wo Contrast  Result Date: 03/07/2018 CLINICAL DATA:  Progressing altered mental status over the last week. EXAM: CT HEAD WITHOUT CONTRAST TECHNIQUE: Contiguous axial images were obtained from the base of the skull through the vertex without intravenous contrast. COMPARISON:  05/23/2017 FINDINGS: Brain: No evidence of acute infarction, hemorrhage, hydrocephalus, extra-axial collection or mass lesion/mass effect. Mild cerebral atrophy. Vascular: Moderate intracranial arterial vascular calcifications. Skull: Calvarium appears intact. Sinuses/Orbits: Paranasal sinuses and mastoid air cells are clear. Other: None. IMPRESSION: No acute intracranial abnormality.  Mild cerebral atrophy. Electronically Signed   By: Burman Nieves M.D.   On: 03/07/2018 21:53    EKG: Independently reviewed. Sinus rhythm.   Assessment/Plan Active Problems:   Dysphagia, pharyngoesophageal phase   Acute encephalopathy   Hypernatremia   Essential hypertension   Dementia due to Parkinson's disease without behavioral disturbance (HCC)  1-Acute metabolic encephalopathy; suspect AMS is related to metabolic encephalopathy form hypernatremia, dehydration, hypercalcemia.  -continue with IV fluids, change to D 5.  -will get MRI to rule out stroke and Press. SBP elevated.  -if no improvement by tomorrow ,  will need NGT for medications. Family would like to hold on NGT at this point.  -will check  B 12, TSH. Ammonia normal.  -check ABG.  -no evidence of infection, UA clean, chest x ray negative.   2-Hypertension;  SBP elevated at 180.  Hold oral home BP medication. Diuretics/.  IV Hydralazine PRN ordered.  check MRI, if evidence of PRESS will need transfer to ICU for better BP controlled.   3-Hypernatremia; from poor oral intake. IV fluid, D 5. Repeat labs in am.   4-Parkin's diseases;  Resume home medications when able to take oral. Might need NGT for meds.   5-Constipation; Check KUB, might need enema depending on X ray results.   6-Anemia; check anemia panel in am.     DVT Prophylaxis: Lovenox Code Status: Full code, son will speak with rest of the family about it.  Family Communication: Son at bedside.  Disposition Plan: Remain in the hospital for IV fluids.  Alba CoryBelkys A Carrol Hougland MD [facility] Pager [phone]  If 7PM-7AM, please contact night-coverage [email or web address] Password [email or web address]  03/08/2018, 3:54 PM

## 2018-03-08 NOTE — ED Notes (Signed)
[facility]

## 2018-03-08 NOTE — ED Notes (Signed)
Pt family sitting with pt at this time.

## 2018-03-08 NOTE — ED Notes (Signed)
Pt in NAD, RR even and unlabored. Call bell within reach. 

## 2018-03-08 NOTE — Progress Notes (Addendum)
Rt attempted the ABG 2 times with no success. RT called the doctor and she asked for another RT to try. If we are not able to get the ABG she said to get an ABG

## 2018-03-08 NOTE — ED Notes (Addendum)
Dr. Penne LashIssacs made aware of pt's BP

## 2018-03-08 NOTE — ED Notes (Signed)
Report to Sophia, RN at [facility]  

## 2018-03-09 DIAGNOSIS — G20 Parkinson's disease: Secondary | ICD-10-CM | POA: Diagnosis not present

## 2018-03-09 DIAGNOSIS — Z66 Do not resuscitate: Secondary | ICD-10-CM | POA: Diagnosis not present

## 2018-03-09 DIAGNOSIS — E440 Moderate protein-calorie malnutrition: Secondary | ICD-10-CM | POA: Diagnosis not present

## 2018-03-09 DIAGNOSIS — Z8673 Personal history of transient ischemic attack (TIA), and cerebral infarction without residual deficits: Secondary | ICD-10-CM | POA: Diagnosis not present

## 2018-03-09 DIAGNOSIS — F05 Delirium due to known physiological condition: Secondary | ICD-10-CM | POA: Diagnosis not present

## 2018-03-09 DIAGNOSIS — G9341 Metabolic encephalopathy: Secondary | ICD-10-CM | POA: Diagnosis not present

## 2018-03-09 DIAGNOSIS — Z6822 Body mass index (BMI) 22.0-22.9, adult: Secondary | ICD-10-CM | POA: Diagnosis not present

## 2018-03-09 DIAGNOSIS — Z79899 Other long term (current) drug therapy: Secondary | ICD-10-CM | POA: Diagnosis not present

## 2018-03-09 DIAGNOSIS — G9340 Encephalopathy, unspecified: Secondary | ICD-10-CM | POA: Diagnosis not present

## 2018-03-09 DIAGNOSIS — Z515 Encounter for palliative care: Secondary | ICD-10-CM | POA: Diagnosis not present

## 2018-03-09 DIAGNOSIS — G2581 Restless legs syndrome: Secondary | ICD-10-CM | POA: Diagnosis present

## 2018-03-09 DIAGNOSIS — E876 Hypokalemia: Secondary | ICD-10-CM | POA: Diagnosis present

## 2018-03-09 DIAGNOSIS — D638 Anemia in other chronic diseases classified elsewhere: Secondary | ICD-10-CM | POA: Diagnosis not present

## 2018-03-09 DIAGNOSIS — E119 Type 2 diabetes mellitus without complications: Secondary | ICD-10-CM | POA: Diagnosis present

## 2018-03-09 DIAGNOSIS — Z7189 Other specified counseling: Secondary | ICD-10-CM | POA: Diagnosis not present

## 2018-03-09 DIAGNOSIS — F0280 Dementia in other diseases classified elsewhere without behavioral disturbance: Secondary | ICD-10-CM | POA: Diagnosis not present

## 2018-03-09 DIAGNOSIS — E870 Hyperosmolality and hypernatremia: Secondary | ICD-10-CM | POA: Diagnosis not present

## 2018-03-09 DIAGNOSIS — E860 Dehydration: Secondary | ICD-10-CM | POA: Diagnosis not present

## 2018-03-09 DIAGNOSIS — E871 Hypo-osmolality and hyponatremia: Secondary | ICD-10-CM | POA: Diagnosis not present

## 2018-03-09 DIAGNOSIS — R400 Somnolence: Secondary | ICD-10-CM | POA: Diagnosis not present

## 2018-03-09 DIAGNOSIS — R1314 Dysphagia, pharyngoesophageal phase: Secondary | ICD-10-CM | POA: Diagnosis not present

## 2018-03-09 DIAGNOSIS — R001 Bradycardia, unspecified: Secondary | ICD-10-CM | POA: Diagnosis not present

## 2018-03-09 DIAGNOSIS — K5900 Constipation, unspecified: Secondary | ICD-10-CM | POA: Diagnosis present

## 2018-03-09 DIAGNOSIS — I10 Essential (primary) hypertension: Secondary | ICD-10-CM | POA: Diagnosis present

## 2018-03-09 LAB — CBC
HEMATOCRIT: 30.4 % — AB (ref 36.0–46.0)
HEMOGLOBIN: 9.7 g/dL — AB (ref 12.0–15.0)
MCH: 31.8 pg (ref 26.0–34.0)
MCHC: 31.9 g/dL (ref 30.0–36.0)
MCV: 99.7 fL (ref 80.0–100.0)
NRBC: 0 % (ref 0.0–0.2)
Platelets: 180 10*3/uL (ref 150–400)
RBC: 3.05 MIL/uL — ABNORMAL LOW (ref 3.87–5.11)
RDW: 12.7 % (ref 11.5–15.5)
WBC: 7.6 10*3/uL (ref 4.0–10.5)

## 2018-03-09 LAB — TSH: TSH: 0.092 u[IU]/mL — AB (ref 0.350–4.500)

## 2018-03-09 LAB — COMPREHENSIVE METABOLIC PANEL
ALT: 19 U/L (ref 0–44)
ANION GAP: 8 (ref 5–15)
AST: 28 U/L (ref 15–41)
Albumin: 3.3 g/dL — ABNORMAL LOW (ref 3.5–5.0)
Alkaline Phosphatase: 41 U/L (ref 38–126)
BILIRUBIN TOTAL: 1.4 mg/dL — AB (ref 0.3–1.2)
BUN: 31 mg/dL — ABNORMAL HIGH (ref 8–23)
CHLORIDE: 113 mmol/L — AB (ref 98–111)
CO2: 23 mmol/L (ref 22–32)
Calcium: 10.5 mg/dL — ABNORMAL HIGH (ref 8.9–10.3)
Creatinine, Ser: 1.11 mg/dL — ABNORMAL HIGH (ref 0.44–1.00)
GFR, EST AFRICAN AMERICAN: 51 mL/min — AB (ref >60)
GFR, EST NON AFRICAN AMERICAN: 44 mL/min — AB (ref >60)
Glucose, Bld: 139 mg/dL — ABNORMAL HIGH (ref 70–99)
POTASSIUM: 3.3 mmol/L — AB (ref 3.5–5.1)
Sodium: 144 mmol/L (ref 135–145)
TOTAL PROTEIN: 5.8 g/dL — AB (ref 6.5–8.1)

## 2018-03-09 LAB — RETICULOCYTES
IMMATURE RETIC FRACT: 6.5 % (ref 2.3–15.9)
RBC.: 3.05 MIL/uL — AB (ref 3.87–5.11)
RETIC CT PCT: 1.2 % (ref 0.4–3.1)
Retic Count, Absolute: 36.6 10*3/uL (ref 19.0–186.0)

## 2018-03-09 LAB — IRON AND TIBC
Iron: 72 ug/dL (ref 28–170)
SATURATION RATIOS: 32 % — AB (ref 10.4–31.8)
TIBC: 228 ug/dL — AB (ref 250–450)
UIBC: 156 ug/dL

## 2018-03-09 LAB — URINE CULTURE
Culture: NO GROWTH
Special Requests: NORMAL

## 2018-03-09 LAB — T4, FREE: Free T4: 1.64 ng/dL (ref 0.82–1.77)

## 2018-03-09 LAB — VITAMIN B12: VITAMIN B 12: 968 pg/mL — AB (ref 180–914)

## 2018-03-09 LAB — FERRITIN: Ferritin: 156 ng/mL (ref 11–307)

## 2018-03-09 LAB — FOLATE: FOLATE: 31.6 ng/mL (ref >5.9)

## 2018-03-09 MED ORDER — FLEET ENEMA 7-19 GM/118ML RE ENEM
1.0000 | ENEMA | Freq: Once | RECTAL | Status: AC
  Administered 2018-03-09: 1 via RECTAL
  Filled 2018-03-09: qty 1

## 2018-03-09 MED ORDER — ENTACAPONE 200 MG PO TABS
200.0000 mg | ORAL_TABLET | Freq: Three times a day (TID) | ORAL | Status: DC
  Filled 2018-03-09: qty 1

## 2018-03-09 MED ORDER — POTASSIUM CHLORIDE 10 MEQ/100ML IV SOLN
10.0000 meq | INTRAVENOUS | Status: AC
  Administered 2018-03-09 (×3): 10 meq via INTRAVENOUS
  Filled 2018-03-09 (×3): qty 100

## 2018-03-09 MED ORDER — CARBIDOPA-LEVODOPA 25-100 MG PO TABS
1.0000 | ORAL_TABLET | Freq: Three times a day (TID) | ORAL | Status: DC
  Administered 2018-03-09 – 2018-03-14 (×17): 1 via ORAL
  Filled 2018-03-09 (×16): qty 1

## 2018-03-09 MED ORDER — ENTACAPONE 200 MG PO TABS
200.0000 mg | ORAL_TABLET | Freq: Three times a day (TID) | ORAL | Status: DC
  Administered 2018-03-09 – 2018-03-14 (×17): 200 mg via ORAL
  Filled 2018-03-09 (×17): qty 1

## 2018-03-09 MED ORDER — CARBIDOPA-LEVODOPA 25-100 MG PO TABS
1.0000 | ORAL_TABLET | Freq: Three times a day (TID) | ORAL | Status: DC
  Filled 2018-03-09: qty 1

## 2018-03-09 MED ORDER — SORBITOL 70 % SOLN
960.0000 mL | TOPICAL_OIL | Freq: Once | ORAL | Status: AC
  Administered 2018-03-09: 960 mL via RECTAL
  Filled 2018-03-09: qty 473

## 2018-03-09 MED ORDER — FLUCONAZOLE 100MG IVPB
100.0000 mg | INTRAVENOUS | Status: DC
  Administered 2018-03-09 – 2018-03-13 (×5): 100 mg via INTRAVENOUS
  Filled 2018-03-09 (×5): qty 50

## 2018-03-09 MED ORDER — CARBIDOPA-LEVODOPA-ENTACAPONE 25-100-200 MG PO TABS: 1.0000 | ORAL_TABLET | Freq: Three times a day (TID) | ORAL | Status: DC

## 2018-03-09 NOTE — Evaluation (Addendum)
Clinical/Bedside Swallow Evaluation Patient Details  Name: [name] MRN: [id number] Date of Birth: 03/24/1930  Today's Date: 03/09/2018 Time: SLP Start Time (ACUTE ONLY): 1218 SLP Stop Time (ACUTE ONLY): 1250 SLP Time Calculation (min) (ACUTE ONLY): 32 min  Past Medical History:  Past Medical History:  Diagnosis Date  . Anxiety   . Cerebrovascular disease   . Depression   . Diabetes mellitus without complication (HCC)   . Dysphagia, pharyngoesophageal phase 08/12/2014   Dysphagia for liquids  . Gait disorder   . Hypertension   . Meralgia paresthetica    Bilateral  . Movement disorder    Parkinson's disease  . REM sleep behavior disorder   . Restless leg syndrome    Past Surgical History:  Past Surgical History:  Procedure Laterality Date  . None     HPI:  84 yo female adm with AMS, decreased po intake - refusing medications and foods x 1 week.  Pt has h/o Parkinsons disease (15 years ago diagnosed) hypernatremia, anxiety, DM.  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  Family not aware when pt had stroke.  Pt CXR negative.  Swallow evaluation ordered.   Per daughter, pt was feeding herself until a few weeks ago.    Assessment / Plan / Recommendation Clinical Impression  Patient currently presents with significant weakness and delayed motor initiation.  She did accept oral care - whitish coating on tongue without awareness - ? oral candidiasis or resdiual medication???  After oral care with moisture, pt did elicit severely delayed swallowed.  Icecream boluses were transited with suspected pharyngeal delay swallow with excessive effort (? lingual pumping, etc) however no indication of aspiration noted.  Liquids provided via tsp and end of straw as pt unable to form suction on straw.  Due to decreased mentation, recommend only provided pt necessary medications crushed with icecream if can tolerate *especially Parkinsons medication* and tsps water.   Otherwise recommend MBS when pt able to participate, decreased lethargy.  Pt needs to get her Parkinsons medications if possible due to concerns for initiation of movement/swallowing without it.  Educated daughter and son in law to findings/recommendations using teach back - to which they agreed.   Recommend consider palliative referral given daughter states pt sleeps a lot a home and pt appears contracted.   Daughter interpreted for pt who appears HOH.   SLP Visit Diagnosis: Dysphagia, oropharyngeal phase (R13.12)    Aspiration Risk  Moderate aspiration risk;Risk for inadequate nutrition/hydration    Diet Recommendation NPO;NPO except meds(tsps water only)   Liquid Administration via: Spoon(end of straw) Medication Administration: Crushed with puree(with icecream) Supervision: Full supervision/cueing for compensatory strategies;Staff to assist with self feeding    Other  Recommendations Oral Care Recommendations: Oral care QID   Follow up Recommendations        Frequency and Duration min 1 x/week  1 week       Prognosis Prognosis for Safe Diet Advancement: Fair Barriers to Reach Goals: Time post onset;Other (Comment)(parkinsons disease)      Swallow Study   General Date of Onset: 03/09/18 HPI: 84 yo female adm with AMS, decreased po intake - refusing medications and foods x 1 week.  Pt has h/o Parkinsons disease (15 years ago diagnosed) hypernatremia, anxiety, DM.  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  Family not aware when pt had stroke.  Pt CXR negative.  Swallow evaluation ordered.   Type of Study: Bedside Swallow Evaluation  Diet Prior to this Study: NPO Temperature Spikes Noted: No Respiratory Status: Room air History of Recent Intubation: No Behavior/Cognition: Lethargic/Drowsy;Other (Comment)(follows some directions) Oral Cavity Assessment: Dried secretions(xerostomia - whitish coating posterior ? consistent with oralcandidiasis or  resdiual) Oral Care Completed by SLP: Yes Oral Cavity - Dentition: Poor condition(very poor condition) Vision: Impaired for self-feeding Self-Feeding Abilities: Total assist Patient Positioning: Upright in bed;Other (comment)(pt is contracted which daughter states is baseline - ) Baseline Vocal Quality: Not observed Volitional Cough: Cognitively unable to elicit Volitional Swallow: Able to elicit    Oral/Motor/Sensory Function Overall Oral Motor/Sensory Function: Generalized oral weakness(pt unable to form suction on straw, ? right labial weaknesss)   Ice Chips Ice chips: Not tested   Thin Liquid Thin Liquid: Impaired Presentation: Spoon;Straw Oral Phase Impairments: Reduced labial seal Oral Phase Functional Implications: Right anterior spillage;Prolonged oral transit Pharyngeal  Phase Impairments: Suspected delayed Swallow;Throat Clearing - Delayed    Nectar Thick Nectar Thick Liquid: Not tested   Honey Thick Honey Thick Liquid: Not tested   Puree Puree: Impaired(icecream) Presentation: Spoon Oral Phase Impairments: Reduced labial seal;Reduced lingual movement/coordination Oral Phase Functional Implications: Prolonged oral transit Pharyngeal Phase Impairments: Suspected delayed Swallow   Solid     Solid: Not tested      Chales Abrahams 03/09/2018,1:16 PM   Donavan Burnet, MS [facility] SLP Acute Rehab Services Pager [phone] Office [phone]

## 2018-03-09 NOTE — Progress Notes (Signed)
PROGRESS NOTE    [name]  ZOX:[id number] DOB: 04/03/1930 DOA: 03/07/2018 PCP: Iona HansenJones, Penny L, NP  Brief Narrative:84 year old past medical history for stroke, diabetes, Parkinson disease who at baseline is able to speak a few words. Patient over the last 4 days has not been able to eat, she has been more lethargic.  Patient was admitted with acute metabolic encephalopathy. MRI of brain was negative for acute stroke. She was found to have hypernatremia, and hypercalcemia.   Assessment & Plan:   Active Problems:   Dysphagia, pharyngoesophageal phase   Acute encephalopathy   Hypernatremia   Essential hypertension   Dementia due to Parkinson's disease without behavioral disturbance (HCC)  1-Acute metabolic encephalopathy; suspect AMS is related to metabolic encephalopathy form hypernatremia, dehydration, hypercalcemia.  -continue with IV fluids, change to D 5.  -MRI negative for stroke -B 12 normal, TSH no, free T4 normal. Ammonia normal.  -ABG Negative for hypercapnia.  -no evidence of infection, UA clean, chest x ray negative.  -will continue with IV fluids today, reassess BS last 24 hours. If no improvement will probably require palliative care consult.  2-Hypertension;  SBP elevated at 180.  Hold oral home BP medication. Diuretics/.  IV Hydralazine PRN ordered.  MRI negative for stroke.   3-Hypernatremia; from poor oral intake. IV fluid, D 5. Will continue with IV fluids, patient still lethargic unable to take oral liquids  4-Parkin's diseases;  Resume home medications today. Will see if she is able to swallow pills.   5-Constipation;  KUB negative for obstruction. She received a fleets enema. Might require further  enema  6-Anemia; probably anemia of chronic disease B12 968, folate 31,ferritin 156,[place] 72.  Hypokalemia; replete IV,  unable to tolerate orals RN Pressure Injury Documentation:    Malnutrition Type:      Malnutrition Characteristics:      Nutrition Interventions:     Estimated body mass index is 22.8 kg/m as calculated from the following:   Height as of this encounter: 4\' 7"  (1.397 m).   Weight as of this encounter: 44.5 kg.   DVT prophylaxis: Lovenox Code Status: . Full Code.  Family Communication: daughter at bedside Disposition Plan: patient will need to remain inpatient for IV fluids, the Patient  is still lethargic and unable to take orals   Consultants:   none   Procedures:   none   Antimicrobials:   None   Subjective: Patient will open eyes today, she will open her mouth on command. She still sleepy and unable to eat.   Objective: Vitals:   03/08/18 1644 03/08/18 1931 03/09/18 0428 03/09/18 0840  BP: (!) 113/48 (!) 142/68 (!) 148/82 (!) 163/53  Pulse: 77 78 66 63  Resp:   20 20  Temp:  99.1 F (37.3 C) 98.2 F (36.8 C) 98.3 F (36.8 C)  TempSrc:  Axillary Oral Axillary  SpO2:  99% 100% 100%  Weight:   44.5 kg   Height:        Intake/Output Summary (Last 24 hours) at 03/09/2018 1340 Last data filed at 03/09/2018 1208 Gross per 24 hour  Intake 2009.93 ml  Output 1 ml  Net 2008.93 ml   Filed Weights   03/07/18 1846 03/08/18 1518 03/09/18 0428  Weight: 50 kg 45.7 kg 44.5 kg    Examination:  General exam: lethargic in no distress Respiratory system: Clear to auscultation. Respiratory effort normal. Cardiovascular system: S1 & S2 heard, RRR. No JVD, murmurs, rubs, gallops or clicks. No  pedal edema. Gastrointestinal system: Abdomen is nondistended, soft and nontender. No organomegaly or masses felt. Normal bowel sounds heard. Central nervous system: still lethargic, opens eyes.  Extremities: Symmetric 5 x 5 power. Skin: No rashes, lesions or ulcers    Data Reviewed: I have personally reviewed following labs and imaging studies  CBC: Recent Labs  Lab 03/07/18 1852 03/08/18 1526 03/09/18 0440  WBC 9.0 9.2 7.6  NEUTROABS 6.6  --   --   HGB 10.8* 10.7* 9.7*  HCT  33.8* 33.4* 30.4*  MCV 100.6* 99.4 99.7  PLT 193 190 180   Basic Metabolic Panel: Recent Labs  Lab 03/07/18 1852 03/08/18 1526 03/09/18 0440  NA 149* 151* 144  K 3.9 4.0 3.3*  CL 116* 120* 113*  CO2 22 24 23   GLUCOSE 151* 111* 139*  BUN 37* 31* 31*  CREATININE 1.25* 1.06* 1.11*  CALCIUM 11.6* 11.1* 10.5*   GFR: Estimated Creatinine Clearance: 21.9 mL/min (A) (by C-G formula based on SCr of 1.11 mg/dL (H)). Liver Function Tests: Recent Labs  Lab 03/07/18 1852 03/09/18 0440  AST 24 28  ALT 21 19  ALKPHOS 45 41  BILITOT 0.9 1.4*  PROT 7.2 5.8*  ALBUMIN 4.1 3.3*   No results for input(s): LIPASE, AMYLASE in the last 168 hours. Recent Labs  Lab 03/07/18 1852  AMMONIA 15   Coagulation Profile: No results for input(s): INR, PROTIME in the last 168 hours. Cardiac Enzymes: Recent Labs  Lab 03/07/18 1852  TROPONINI 0.03*   BNP (last 3 results) No results for input(s): PROBNP in the last 8760 hours. HbA1C: No results for input(s): HGBA1C in the last 72 hours. CBG: Recent Labs  Lab 03/08/18 1349 03/08/18 1529  GLUCAP 99 96   Lipid Profile: No results for input(s): CHOL, HDL, LDLCALC, TRIG, CHOLHDL, LDLDIRECT in the last 72 hours. Thyroid Function Tests: Recent Labs    03/09/18 0440  TSH 0.092*  FREET4 1.64   Anemia Panel: Recent Labs    03/09/18 0440  VITAMINB12 968*  FOLATE 31.6  FERRITIN 156  TIBC 228*  IRON 72  RETICCTPCT 1.2   Sepsis Labs: No results for input(s): PROCALCITON, LATICACIDVEN in the last 168 hours.  Recent Results (from the past 240 hour(s))  Urine culture     Status: None   Collection Time: 03/07/18  6:52 PM  Result Value Ref Range Status   Specimen Description   Final    URINE, CATHETERIZED Performed at [facility], [address] [place], [place] [address]    Special Requests   Final    Normal Performed at [facility], [address] [place], [place] [address]    Culture   Final    NO  GROWTH Performed at [facility], [address], [place], [place] [address]    Report Status 03/09/2018 FINAL  Final  Urine culture     Status: Abnormal (Preliminary result)   Collection Time: 03/08/18  8:07 AM  Result Value Ref Range Status   Specimen Description   Final    URINE, CATHETERIZED Performed at [facility], [address] [place], [place] [address]    Special Requests   Final    NONE Performed at [facility], [address] [place], [place] [address]    Culture 20,000 COLONIES/mL ESCHERICHIA COLI (A)  Final   Report Status PENDING  Incomplete         Radiology Studies: Dg  Chest 2 View  Result Date: 03/07/2018 CLINICAL DATA:  Acute mental status change. EXAM: CHEST - 2 VIEW COMPARISON:  July 18, 2007 FINDINGS: Compression fracture midthoracic vertebral body, age indeterminate but new since 2009. The heart, hila, mediastinum, lungs, and pleura are otherwise unremarkable. IMPRESSION: Compression fracture of a midthoracic vertebral body, new since 2009 but otherwise age indeterminate. Recommend clinical correlation. No other acute abnormalities. Electronically Signed   By: Gerome Sam III M.D   On: 03/07/2018 20:57   Dg Abd 1 View  Result Date: 03/08/2018 CLINICAL DATA:  Constipation EXAM: ABDOMEN - 1 VIEW COMPARISON:  None. FINDINGS: Moderate amount of stool throughout the colon. There is no bowel dilatation to suggest obstruction. There is no evidence of pneumoperitoneum, portal venous gas or pneumatosis. There are no pathologic calcifications along the expected course of the ureters. Abdominal aortic atherosclerosis. The osseous structures are unremarkable. IMPRESSION: Moderate amount of stool throughout the colon. Electronically Signed   By: Elige Ko   On: 03/08/2018 19:56   Ct Head Wo Contrast  Result Date: 03/07/2018 CLINICAL DATA:  Progressing altered mental status over the last week. EXAM: CT HEAD WITHOUT CONTRAST  TECHNIQUE: Contiguous axial images were obtained from the base of the skull through the vertex without intravenous contrast. COMPARISON:  05/23/2017 FINDINGS: Brain: No evidence of acute infarction, hemorrhage, hydrocephalus, extra-axial collection or mass lesion/mass effect. Mild cerebral atrophy. Vascular: Moderate intracranial arterial vascular calcifications. Skull: Calvarium appears intact. Sinuses/Orbits: Paranasal sinuses and mastoid air cells are clear. Other: None. IMPRESSION: No acute intracranial abnormality.  Mild cerebral atrophy. Electronically Signed   By: Burman Nieves M.D.   On: 03/07/2018 21:53   Mr Brain Wo Contrast  Result Date: 03/08/2018 CLINICAL DATA:  Lethargy, anorexia for 4 days. History of Parkinson's disease, hypertension. EXAM: MRI HEAD WITHOUT CONTRAST TECHNIQUE: Multiplanar, multiecho pulse sequences of the brain and surrounding structures were obtained without intravenous contrast. COMPARISON:  CT HEAD March 07, 2018 FINDINGS: Moderately motion degraded examination. INTRACRANIAL CONTENTS: No reduced diffusion to suggest acute ischemia. No susceptibility artifact to suggest hemorrhage. The ventricles and sulci are normal for patient's age. Patchy supratentorial white matter FLAIR T2 hyperintensities. Old RIGHT cerebellar pontine small infarcts. Hazy T2 hyperintense signal within the basal ganglia and thalami associated with chronic small vessel ischemic changes. No suspicious parenchymal signal, masses, mass effect. No abnormal extra-axial fluid collections. No extra-axial masses. VASCULAR: Normal major intracranial vascular flow voids present at skull base. SKULL AND UPPER CERVICAL SPINE: No abnormal sellar expansion. No suspicious calvarial bone marrow signal. Craniocervical junction maintained. SINUSES/ORBITS: Mild paranasal sinus mucosal thickening. Mastoid air cells are well aerated.The included ocular globes and orbital contents are non-suspicious. OTHER: None.  IMPRESSION: 1. Moderately motion degraded examination. No acute intracranial process. 2. 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 3. Mild-to-moderate chronic small vessel ischemic changes. Electronically Signed   By: Awilda Metro M.D.   On: 03/08/2018 19:30        Scheduled Meds: . carbidopa-levodopa  1 tablet Oral TID   Or  . entacapone  200 mg Oral TID  . enoxaparin (LOVENOX) injection  30 mg Subcutaneous Q24H   Continuous Infusions: . acetaminophen Stopped (03/08/18 2300)  . dextrose 100 mL/hr at 03/09/18 0521  . fluconazole (DIFLUCAN) IV       LOS: 0 days    Time spent: 35 minutes.     Alba Cory, MD [facility] Pager [phone]  If 7PM-7AM, please contact night-coverage [email or web address] Password [email or web address] 03/09/2018, 1:40 PM

## 2018-03-09 NOTE — Care Management Obs Status (Signed)
MEDICARE OBSERVATION STATUS NOTIFICATION   Patient Details  Name: [name] GowerQuy T Lightcap MRN: [id number] Date of Birth: 09/30/1929   Medicare Observation Status Notification Given:  Yes    McGibboneyFelicity Coyer, Jan Walters, RN 03/09/2018, 1:20 PM

## 2018-03-10 DIAGNOSIS — E440 Moderate protein-calorie malnutrition: Secondary | ICD-10-CM

## 2018-03-10 DIAGNOSIS — E860 Dehydration: Secondary | ICD-10-CM

## 2018-03-10 LAB — BASIC METABOLIC PANEL
Anion gap: 7 (ref 5–15)
BUN: 22 mg/dL (ref 8–23)
CHLORIDE: 105 mmol/L (ref 98–111)
CO2: 26 mmol/L (ref 22–32)
CREATININE: 0.89 mg/dL (ref 0.44–1.00)
Calcium: 10.5 mg/dL — ABNORMAL HIGH (ref 8.9–10.3)
GFR calc Af Amer: >60 mL/min (ref >60)
GFR calc non Af Amer: 57 mL/min — ABNORMAL LOW (ref >60)
Glucose, Bld: 138 mg/dL — ABNORMAL HIGH (ref 70–99)
POTASSIUM: 3.5 mmol/L (ref 3.5–5.1)
SODIUM: 138 mmol/L (ref 135–145)

## 2018-03-10 LAB — CBC
HEMATOCRIT: 32.6 % — AB (ref 36.0–46.0)
HEMOGLOBIN: 10.6 g/dL — AB (ref 12.0–15.0)
MCH: 31.7 pg (ref 26.0–34.0)
MCHC: 32.5 g/dL (ref 30.0–36.0)
MCV: 97.6 fL (ref 80.0–100.0)
Platelets: 186 10*3/uL (ref 150–400)
RBC: 3.34 MIL/uL — AB (ref 3.87–5.11)
RDW: 12.2 % (ref 11.5–15.5)
WBC: 10.3 10*3/uL (ref 4.0–10.5)
nRBC: 0 % (ref 0.0–0.2)

## 2018-03-10 LAB — URINE CULTURE: Culture: 20000 — AB

## 2018-03-10 LAB — T3, FREE: T3 FREE: 2 pg/mL (ref 2.0–4.4)

## 2018-03-10 MED ORDER — SENNOSIDES-DOCUSATE SODIUM 8.6-50 MG PO TABS
1.0000 | ORAL_TABLET | Freq: Two times a day (BID) | ORAL | Status: DC
  Administered 2018-03-10 – 2018-03-14 (×9): 1 via ORAL
  Filled 2018-03-10 (×9): qty 1

## 2018-03-10 MED ORDER — POTASSIUM CHLORIDE CRYS ER 20 MEQ PO TBCR
40.0000 meq | EXTENDED_RELEASE_TABLET | Freq: Once | ORAL | Status: AC
  Administered 2018-03-10: 40 meq via ORAL
  Filled 2018-03-10: qty 2

## 2018-03-10 MED ORDER — MAGNESIUM SULFATE 2 GM/50ML IV SOLN
2.0000 g | Freq: Once | INTRAVENOUS | Status: AC
  Administered 2018-03-10: 2 g via INTRAVENOUS
  Filled 2018-03-10: qty 50

## 2018-03-10 NOTE — Progress Notes (Signed)
PROGRESS NOTE    LARINDA HERTER  WRU:[id number] DOB: April 27, 1929 DOA: 03/07/2018 PCP: Iona Hansen, NP  Brief Narrative:84 year old past medical history for stroke, diabetes, Parkinson disease who at baseline is able to speak a few words. Patient over the last 4 days has not been able to eat, she has been more lethargic.  Patient was admitted with acute metabolic encephalopathy. MRI of brain was negative for acute stroke. She was found to have hypernatremia, and hypercalcemia.   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  -continue with IV fluids, D 5.  -MRI negative for stroke -B 12 normal, TSH no, free T4 normal. Ammonia normal.  -ABG Negative for hypercapnia.  -no evidence of infection, UA clean, chest x ray negative.  -she is more alert today, more responsive to family.  Family decline palliative care consult.  Speech evaluation.   2-Hypertension;  Hold oral home BP medication. Diuretics/.  IV Hydralazine PRN ordered.  MRI negative for stroke.  Improved.   3-Hypernatremia; from poor oral intake. IV fluid, D 5. Improved.   4-Parkin's diseases;  Continue with Sinemet, entacapne  5-Constipation;  KUB negative for obstruction. Had BM last night.  Start senna.   6-Anemia; probably anemia of chronic disease B12 968, folate 31,ferritin 156,[place] 72.  Hypokalemia; replete oral.   Bradycardia; replete electrolytes, give mg.  Asymptomatic.   RN Pressure Injury Documentation:    Malnutrition Type:  Nutrition Problem: Moderate Malnutrition Etiology: acute illness(dysphagia)   Malnutrition Characteristics:  Signs/Symptoms:  energy intake < 75% for > 7 days, percent weight loss, mild muscle depletion, moderate muscle depletion   Nutrition Interventions:  Interventions: Ensure Enlive (each supplement provides 350kcal and 20 grams of protein), Refer to RD note for recommendations  Estimated body mass index is 22.8 kg/m as calculated from the following:   Height as of this encounter: 4\' 7"  (1.397 m).   Weight as of this encounter: 44.5 kg.   DVT prophylaxis: Lovenox Code Status: . Full Code.  Family Communication: daughter at bedside, explain to daughter , patient is at high risk for readmission, and recurrent dehydration . Decline palliative  Disposition Plan: patient will need to remain inpatient for IV fluids, the Patient  is still lethargic and unable to take orals   Consultants:   none   Procedures:   none   Antimicrobials:   None   Subjective: She is more alert today. Said few words to family.  Moving upper extremities.   Objective: Vitals:   03/09/18 0840 03/09/18 2042 03/10/18 0638 03/10/18 1325  BP: (!) 163/53 (!) 159/50 (!) 154/54 (!) 127/51  Pulse: 63 (!) 43 (!) 42 (!) 52  Resp: 20 20 18  (!) 24  Temp: 98.3 F (36.8 C) 97.7 F (36.5 C) 97.7 F (36.5 C) 98.2 F (36.8 C)  TempSrc: Axillary Oral Oral Oral  SpO2: 100% 96% 100% 100%  Weight:      Height:        Intake/Output Summary (Last 24 hours) at 03/10/2018 1648 Last data filed at 03/10/2018 1034 Gross per 24 hour  Intake 2263.78 ml  Output 1100 ml  Net 1163.78 ml   Filed Weights   03/07/18 1846 03/08/18 1518 03/09/18 0428  Weight:  50 kg 45.7 kg 44.5 kg    Examination:  General exam: Alert, NAD Respiratory system: CTA Cardiovascular system: S 1, S 2 RRR Gastrointestinal system: BS present, soft, nt Central nervous system: alert, open eyes,  Extremities: rigidity  Skin: No rashes, lesions or ulcers    Data Reviewed: I have personally reviewed following labs and imaging studies  CBC: Recent Labs  Lab  03/07/18 1852 03/08/18 1526 03/09/18 0440 03/10/18 0841  WBC 9.0 9.2 7.6 10.3  NEUTROABS 6.6  --   --   --   HGB 10.8* 10.7* 9.7* 10.6*  HCT 33.8* 33.4* 30.4* 32.6*  MCV 100.6* 99.4 99.7 97.6  PLT 193 190 180 186   Basic Metabolic Panel: Recent Labs  Lab 03/07/18 1852 03/08/18 1526 03/09/18 0440 03/10/18 0841  NA 149* 151* 144 138  K 3.9 4.0 3.3* 3.5  CL 116* 120* 113* 105  CO2 22 24 23 26   GLUCOSE 151* 111* 139* 138*  BUN 37* 31* 31* 22  CREATININE 1.25* 1.06* 1.11* 0.89  CALCIUM 11.6* 11.1* 10.5* 10.5*   GFR: Estimated Creatinine Clearance: 27.4 mL/min (by C-G formula based on SCr of 0.89 mg/dL). Liver Function Tests: Recent Labs  Lab 03/07/18 1852 03/09/18 0440  AST 24 28  ALT 21 19  ALKPHOS 45 41  BILITOT 0.9 1.4*  PROT 7.2 5.8*  ALBUMIN 4.1 3.3*   No results for input(s): LIPASE, AMYLASE in the last 168 hours. Recent Labs  Lab 03/07/18 1852  AMMONIA 15   Coagulation Profile: No results for input(s): INR, PROTIME in the last 168 hours. Cardiac Enzymes: Recent Labs  Lab 03/07/18 1852  TROPONINI 0.03*   BNP (last 3 results) No results for input(s): PROBNP in the last 8760 hours. HbA1C: No results for input(s): HGBA1C in the last 72 hours. CBG: Recent Labs  Lab 03/08/18 1349 03/08/18 1529  GLUCAP 99 96   Lipid Profile: No results for input(s): CHOL, HDL, LDLCALC, TRIG, CHOLHDL, LDLDIRECT in the last 72 hours. Thyroid Function Tests: Recent Labs    03/09/18 0440  TSH 0.092*  FREET4 1.64  T3FREE 2.0   Anemia Panel: Recent Labs    03/09/18 0440  VITAMINB12 968*  FOLATE 31.6  FERRITIN 156  TIBC 228*  IRON 72  RETICCTPCT 1.2   Sepsis Labs: No results for input(s): PROCALCITON, LATICACIDVEN in the last 168 hours.  Recent Results (from the past 240 hour(s))  Urine culture     Status: None   Collection Time: 03/07/18  6:52 PM  Result Value Ref Range Status   Specimen Description   Final    URINE, CATHETERIZED Performed at [facility], [address] [place], [place] [address]    Special Requests   Final    Normal Performed at [facility], [address] [place], [place] [address]    Culture   Final    NO GROWTH Performed at [facility], [address], [place], [place] [address]    Report Status 03/09/2018 FINAL  Final  Urine culture     Status: Abnormal   Collection Time: 03/08/18  8:07 AM  Result Value Ref Range Status   Specimen Description   Final    URINE, CATHETERIZED Performed at [facility], [address] [place], [place] [address]    Special Requests   Final    NONE Performed at [facility], [address] [place], [place] [address]  Culture 20,000 COLONIES/mL ESCHERICHIA COLI (A)  Final   Report Status 03/10/2018 FINAL  Final   Organism ID, Bacteria ESCHERICHIA COLI (A)  Final      Susceptibility   Escherichia coli - MIC*    AMPICILLIN >=32 RESISTANT Resistant     CEFAZOLIN <=4 SENSITIVE Sensitive     CEFTRIAXONE <=1 SENSITIVE Sensitive     CIPROFLOXACIN >=4 RESISTANT Resistant     GENTAMICIN <=1 SENSITIVE Sensitive     IMIPENEM <=0.25 SENSITIVE Sensitive     NITROFURANTOIN <=16 SENSITIVE Sensitive     TRIMETH/SULFA <=20 SENSITIVE Sensitive     AMPICILLIN/SULBACTAM 16 INTERMEDIATE Intermediate     PIP/TAZO <=4 SENSITIVE Sensitive     Extended ESBL NEGATIVE Sensitive     * 20,000 COLONIES/mL ESCHERICHIA COLI         Radiology Studies: Dg Abd 1 View  Result Date: 03/08/2018 CLINICAL DATA:  Constipation EXAM: ABDOMEN - 1 VIEW COMPARISON:  None. FINDINGS: Moderate amount of stool throughout the colon. There is no bowel dilatation to suggest obstruction. There is no evidence of pneumoperitoneum, portal venous gas or pneumatosis. There are no pathologic calcifications along the expected course of the ureters. Abdominal aortic atherosclerosis. The osseous structures are unremarkable. IMPRESSION: Moderate amount of stool  throughout the colon. Electronically Signed   By: Elige KoHetal  Patel   On: 03/08/2018 19:56   Mr Brain Wo Contrast  Result Date: 03/08/2018 CLINICAL DATA:  Lethargy, anorexia for 4 days. History of Parkinson's disease, hypertension. EXAM: MRI HEAD WITHOUT CONTRAST TECHNIQUE: Multiplanar, multiecho pulse sequences of the brain and surrounding structures were obtained without intravenous contrast. COMPARISON:  CT HEAD March 07, 2018 FINDINGS: Moderately motion degraded examination. INTRACRANIAL CONTENTS: No reduced diffusion to suggest acute ischemia. No susceptibility artifact to suggest hemorrhage. The ventricles and sulci are normal for patient's age. Patchy supratentorial white matter FLAIR T2 hyperintensities. Old RIGHT cerebellar pontine small infarcts. Hazy T2 hyperintense signal within the basal ganglia and thalami associated with chronic small vessel ischemic changes. No suspicious parenchymal signal, masses, mass effect. No abnormal extra-axial fluid collections. No extra-axial masses. VASCULAR: Normal major intracranial vascular flow voids present at skull base. SKULL AND UPPER CERVICAL SPINE: No abnormal sellar expansion. No suspicious calvarial bone marrow signal. Craniocervical junction maintained. SINUSES/ORBITS: Mild paranasal sinus mucosal thickening. Mastoid air cells are well aerated.The included ocular globes and orbital contents are non-suspicious. OTHER: None. IMPRESSION: 1. Moderately motion degraded examination. No acute intracranial process. 2. 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 3. Mild-to-moderate chronic small vessel ischemic changes. Electronically Signed   By: Awilda Metroourtnay  Bloomer M.D.   On: 03/08/2018 19:30        Scheduled Meds: . carbidopa-levodopa  1 tablet Oral TID   And  . entacapone  200 mg Oral TID  . enoxaparin (LOVENOX) injection  30 mg Subcutaneous Q24H  . potassium chloride  40 mEq Oral Once   Continuous Infusions: . dextrose 100 mL/hr at 03/10/18  1258  . fluconazole (DIFLUCAN) IV 100 mg (03/10/18 1627)  . magnesium sulfate 1 - 4 g bolus IVPB       LOS: 1 day    Time spent: 35 minutes.     Alba CoryBelkys A Regalado, MD [facility] Pager [phone]  If 7PM-7AM, please contact night-coverage [email or web address] Password [email or web address] 03/10/2018, 4:48 PM

## 2018-03-10 NOTE — Progress Notes (Signed)
Initial Nutrition Assessment  DOCUMENTATION CODES:   Non-severe (moderate) malnutrition in context of acute illness/injury  INTERVENTION:    Monitor for diet advancement/toleration per SLP  If diet advanced Ensure Enlive po TID, each supplement provides 350 kcal and 20 grams of protein  Will monitor for GOC for possible nutrition support if pt unable to swallow in the near future.   NUTRITION DIAGNOSIS:   Moderate Malnutrition related to acute illness(dysphagia) as evidenced by energy intake < 75% for > 7 days, percent weight loss, mild muscle depletion, moderate muscle depletion  GOAL:   Patient will meet greater than or equal to 90% of their needs  MONITOR:   Diet advancement, Labs, Supplement acceptance, PO intake, Weight trends, I & O's  REASON FOR ASSESSMENT:   Malnutrition Screening Tool    ASSESSMENT:   Patient with PMH significant for CVD, DM, parkinson's diseases who at baseline is able to speak few words per family. Presents this admission with AMS likely related to dehydration and hypernatremia.    Pt unable to answer questions. Spoke with daughter at bedside. Daughter reports pt's intake decreased to bites starting a week ago. States she typically eats 3 small meals with one snack daily that consist of rice, fish, noodles, and chicken. Pt prefers softer foods as they are easier for her to swallow. Daughter reports they tried to provide pt with Ensure right before admission and she liked them. SLP saw pt yesterday and recommend MBS when mentation normalizes. Pt has been taking Parkinson's medication with ice cream this admission.   SLP recommends palliative care if AMS does not resolve as pt has been sleeping throughout most of the day at home.   Daughter endorses pt's UBW stayed around 160 lb and she has gradually lost down to 110 lb over the last year. [facility] and 98 lb this admission (10% wt loss in 4 months,  significant for time frame).   Medications reviewed and include: D5 @ 100 ml/hr Labs reviewed: corrected calcium 11.1 (H)  NUTRITION - FOCUSED PHYSICAL EXAM:    Most Recent Value  Orbital Region  No depletion  Upper Arm Region  Mild depletion  Thoracic and Lumbar Region  Unable to assess  Buccal Region  No depletion  Temple Region  Moderate depletion  Clavicle Bone Region  Mild depletion  Clavicle and Acromion Bone Region  Mild depletion  Scapular Bone Region  Unable to assess  Dorsal Hand  Mild depletion  Patellar Region  Moderate depletion  Anterior Thigh Region  Moderate depletion  Posterior Calf Region  Moderate depletion  Edema (RD Assessment)  None     Diet Order:   Diet Order            Diet NPO time specified Except for: Other (See Comments)  Diet effective now              EDUCATION NEEDS:   Education needs have been addressed  Skin:  Skin Assessment: Reviewed RN Assessment  Last BM:  PTA  Height:   Ht Readings from Last 1 Encounters:  03/08/18 4\' 7"  (1.397 m)    Weight:   Wt Readings from Last 1 Encounters:  03/09/18 44.5 kg    Ideal Body Weight:     BMI:  Body mass index is 22.8 kg/m.  Estimated Nutritional Needs:   Kcal:  1350-1550 kcal  Protein:  65-80 grams  Fluid:  >/= 1.3 L/day   Vanessa Kickarly Maysin Carstens RD,  LDN Clinical Nutrition Pager # - [phone]

## 2018-03-10 NOTE — Plan of Care (Signed)
  Problem: Clinical Measurements: Goal: Diagnostic test results will improve Outcome: Progressing   Problem: Elimination: Goal: Will not experience complications related to urinary retention Outcome: Progressing   Problem: Pain Managment: Goal: General experience of comfort will improve Outcome: Progressing   Problem: Safety: Goal: Ability to remain free from injury will improve Outcome: Progressing   

## 2018-03-10 NOTE — Progress Notes (Signed)
Pt sustained bradycardia on telemetry. HR staying in low 40s. Provider notified. EKG obtained and in chart. Pt alert and vitals stable. No new orders. Will continue to monitor.

## 2018-03-10 NOTE — Progress Notes (Signed)
  Speech Language Pathology Treatment: Dysphagia  Patient Details Name: [name] MRN: [id number] DOB: 01/18/1930 Today's Date: 03/10/2018 Time: [id number] SLP Time Calculation (min) (ACUTE ONLY): 30 min  Assessment / Plan / Recommendation Clinical Impression  Pt with much improved swallow function today.  She did not demonstrate throat clearing as evident yesterday during BSE.  Pt drooling from left labial region with labial loss due to weakness.  Swallow continues to be delayed and suspect pt has dysphagia due to her Parkinson's disease.  She is slow to masticate and presents with mulitple swallows across liquids likely due to oral residuals spilling into pharynx after swallow.     Recommend initiate a diet of dys3/thin with strict precautions.  Pt will only accept small amounts before stating she is finished concerning for nutrition ability. Would still recommend palliative consult given her lack of po after several days of not eating.    Educated daughter and son in law as well as pt to recommendations and precautions and clinical indications of aspiration.  Provided toothettes to aid with oral clearance and provided information in writing.      HPI HPI: 84 yo female adm with AMS, decreased po intake - refusing medications and foods x 1 week.  Pt has h/o Parkinsons disease (15 years ago diagnosed) hypernatremia, anxiety, DM.  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  Family not aware when pt had stroke.  Pt CXR negative.  Swallow evaluation ordered.        SLP Plan  Continue with current plan of care       Recommendations  Diet recommendations: Dysphagia 3 (mechanical soft);Thin liquid Liquids provided via: Cup;Straw;Teaspoon Medication Administration: Whole meds with puree(with icecream) Supervision: Full supervision/cueing for compensatory strategies Compensations: Minimize environmental distractions;Slow rate;Small sips/bites;Lingual sweep for  clearance of pocketing(delayed swallow, check for oral residuals) Postural Changes and/or Swallow Maneuvers: Seated upright 90 degrees                Oral Care Recommendations: Oral care QID Follow up Recommendations: None SLP Visit Diagnosis: Dysphagia, oropharyngeal phase (R13.12) Plan: Continue with current plan of care       GO                Chales AbrahamsKimball, Yale Golla Ann 03/10/2018, 12:58 PM   Donavan Burnetamara Jakera Beaupre, MS [facility] SLP Acute Rehab Services Pager [phone]

## 2018-03-11 LAB — BASIC METABOLIC PANEL
Anion gap: 8 (ref 5–15)
BUN: 22 mg/dL (ref 8–23)
CALCIUM: 10.4 mg/dL — AB (ref 8.9–10.3)
CHLORIDE: 109 mmol/L (ref 98–111)
CO2: 20 mmol/L — ABNORMAL LOW (ref 22–32)
CREATININE: 0.96 mg/dL (ref 0.44–1.00)
GFR, EST NON AFRICAN AMERICAN: 52 mL/min — AB (ref >60)
Glucose, Bld: 102 mg/dL — ABNORMAL HIGH (ref 70–99)
Potassium: 4.8 mmol/L (ref 3.5–5.1)
SODIUM: 137 mmol/L (ref 135–145)

## 2018-03-11 MED ORDER — POLYETHYLENE GLYCOL 3350 17 G PO PACK
17.0000 g | PACK | Freq: Every day | ORAL | Status: DC
  Administered 2018-03-12 – 2018-03-14 (×3): 17 g via ORAL
  Filled 2018-03-11 (×3): qty 1

## 2018-03-11 NOTE — Evaluation (Signed)
Occupational Therapy Evaluation Patient Details Name: [name] MRN: [id number] DOB: 1929-05-23 Today's Date: 03/11/2018    History of Present Illness 84 year old past medical history for stroke, diabetes, Parkinson disease who at baseline is able to speak a few words. Patient over the last 4 days has not been able to eat, she has been more lethargic.Patient was admitted with acute metabolic encephalopathy. MRI of brain was negative for acute stroke. She was found to have hypernatremia, and hypercalcemia.    Clinical Impression   Pt admitted with increased lethargy. Pt currently with functional limitations due to the deficits listed below (see OT Problem List).  Pt will benefit from skilled OT to increase their safety and independence with ADL and functional mobility for ADL to facilitate discharge to venue listed below.   Will place pt on trial OT to see if participation improves.      Follow Up Recommendations  Home health OT;SNF;Supervision/Assistance - 24 hour(depending on family- pt total care)          Precautions / Restrictions Precautions Precautions: Fall Restrictions Weight Bearing Restrictions: No      Mobility Bed Mobility Overal bed mobility: Needs Assistance Bed Mobility: Rolling Rolling: Total assist   Supine to sit: +2 for physical assistance;HOB elevated     General bed mobility comments: rolling to R to position in bed  Transfers                      Balance Overall balance assessment: Needs assistance Sitting-balance support: No upper extremity supported Sitting balance-Leahy Scale: Zero                                     ADL either performed or assessed with clinical judgement   ADL Overall ADL's : Needs assistance/impaired                                       General ADL Comments: Pt total A with all ADL activity.  Attempted to have pt wash face- pt total A. Educated family on positioning in bed for  joint protection and pressure relief.       Vision Patient Visual Report: No change from baseline(per family)              Pertinent Vitals/Pain Pain Assessment: Faces Pain Score: 3  Faces Pain Scale: Hurts little more Pain Location: grimaced with bed mobility Pain Descriptors / Indicators: Grimacing Pain Intervention(s): Limited activity within patient's tolerance     Hand Dominance     Extremity/Trunk Assessment Upper Extremity Assessment Upper Extremity Assessment: LUE deficits/detail;RUE deficits/detail RUE Deficits / Details: AAROM WFL RUE Coordination: decreased fine motor;decreased gross motor LUE Deficits / Details: unsure if pt has tone in LUE or was resisting OT.  AAROM WFL LUE Coordination: decreased gross motor;decreased fine motor   Lower Extremity Assessment Lower Extremity Assessment: Difficult to assess due to impaired cognition       Communication Communication Communication: Prefers language other than [place];Interpreter utilized(daughter translated)   Cognition Arousal/Alertness: Awake/alert Behavior During Therapy: Flat affect Overall Cognitive Status: Difficult to assess                                     General  Comments  Pt unable to participate at this time. Pt maintained a fetal position in the bed. Noticed rigidity in bilateral LE left greater than right. Pt was unable to actively move LE on command. discussed bed positioning to decrease risk of LE contractures. Family reports they are doining position changes. Family is requesting PT to see patient another time to see if her participation level improves. Family report she was more alert yesterday.            Home Living Family/patient expects to be discharged to:: Private residence Living Arrangements: Children Available Help at Discharge: Family Type of Home: House             Bathroom Shower/Tub: Engineer, civil (consulting)Tub/shower unit   Bathroom Toilet: Standard     Home  Equipment: Wheelchair - Fluor Corporationmanual;Walker - 2 wheels   Additional Comments: family will decide which home she will d/c too.      Prior Functioning/Environment Level of Independence: Needs assistance  Gait / Transfers Assistance Needed: last few months a gradual decline. She was ambulating with RW. now family is carring her and she is total care     Comments: Family reported they will consider SNF, but would rather have her d/c home if they can get assistance with her care.        OT Problem List: Decreased strength;Decreased activity tolerance;Impaired balance (sitting and/or standing);Decreased range of motion;Decreased safety awareness;Decreased knowledge of use of DME or AE      OT Treatment/Interventions: Self-care/ADL training;Patient/family education;Therapeutic activities    OT Goals(Current goals can be found in the care plan section) Acute Rehab OT Goals Patient Stated Goal: To see if she can participate with therapy and improve mobility OT Goal Formulation: With family Time For Goal Achievement: 03/18/18  OT Frequency: Min 2X/week   Barriers to D/C:            Co-evaluation              AM-PAC OT "6 Clicks" Daily Activity     Outcome Measure Help from another person eating meals?: Total Help from another person taking care of personal grooming?: Total Help from another person toileting, which includes using toliet, bedpan, or urinal?: Total Help from another person bathing (including washing, rinsing, drying)?: Total Help from another person to put on and taking off regular upper body clothing?: Total   6 Click Score: 5   End of Session Nurse Communication: Mobility status  Activity Tolerance: Patient limited by fatigue Patient left: in bed;with call bell/phone within reach;with family/visitor present;with bed alarm set  OT Visit Diagnosis: Muscle weakness (generalized) (M62.81);Other abnormalities of gait and mobility (R26.89)                Time:  [id number] OT Time Calculation (min): 18 min Charges:  OT General Charges $OT Visit: 1 Visit OT Evaluation $OT Eval Moderate Complexity: 1 Mod  Lise AuerLori Angelicia Lessner, OT Acute Rehabilitation Services [phone] Office- [phone]     Jeray Shugart, Karin GoldenLorraine D 03/11/2018, 3:11 PM

## 2018-03-11 NOTE — Progress Notes (Signed)
PROGRESS NOTE    [name]  ZOX:[id number] DOB: 1930/03/15 DOA: 03/07/2018 PCP: Iona Hansen, NP  Brief Narrative:84 year old past medical history for stroke, diabetes, Parkinson disease who at baseline is able to speak a few words. Patient over the last 4 days has not been able to eat, she has been more lethargic.  Patient was admitted with acute metabolic encephalopathy. MRI of brain was negative for acute stroke. She was found to have hypernatremia, and hypercalcemia.   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  -continue with IV fluids, D 5.  -MRI negative for stroke -B 12 normal, TSH no, free T4 normal. Ammonia normal.  -ABG Negative for hypercapnia.  -no evidence of infection, UA clean, chest x ray negative.  -More alert, per family patient is talking more but confuse, confabulation. Will monitor for now.  -Speech evaluation. Pass swallow.  -not eating a lot per family.   2-Hypertension;  Hold oral home BP medication. Diuretics/.  IV Hydralazine PRN ordered.  MRI negative for stroke.  Improved.   3-Hypernatremia; from poor oral intake. IV fluid, D 5. Improved.   4-Parkin's diseases;  Continue with Sinemet, entacapne  5-Constipation;  KUB negative for obstruction. Had BM last night.  Started  senna.   6-Anemia; probably anemia of chronic disease B12 968, folate 31,ferritin 156,[place] 72.  Hypokalemia; replete oral.   Bradycardia; replete electrolytes, give mg.  Asymptomatic.   RN Pressure Injury Documentation:    Malnutrition Type:  Nutrition Problem: Moderate Malnutrition Etiology: acute  illness(dysphagia)   Malnutrition Characteristics:  Signs/Symptoms: energy intake < 75% for > 7 days, percent weight loss, mild muscle depletion, moderate muscle depletion   Nutrition Interventions:  Interventions: Ensure Enlive (each supplement provides 350kcal and 20 grams of protein), Refer to RD note for recommendations  Estimated body mass index is 22.8 kg/m as calculated from the following:   Height as of this encounter: 4\' 7"  (1.397 m).   Weight as of this encounter: 44.5 kg.   DVT prophylaxis: Lovenox Code Status: . Full Code.  Family Communication: Multiples family member at bedside. Explain to them that Mrs. Leonardo diseases, put her at high risk for dehydration, recurrent admissions, offer to meet with palliative to talk about goals of care, diseases trajectory, options.  Disposition Plan: Remain in the hospital, poor oral intake, palliative care meeting.   Consultants:   none   Procedures:   none   Antimicrobials:   None   Subjective: Patient is more alert, she is talking more, but confuse, confabulation.  Not eating enough.   Objective: Vitals:   03/10/18 0638 03/10/18 1325 03/10/18 2118 03/11/18 0538  BP: (!) 154/54 (!) 127/51 (!) 156/55 (!) 149/63  Pulse: (!) 42 (!) 52 100 (!) 55  Resp: 18 (!) 24 16 15   Temp: 97.7 F (36.5 C) 98.2 F (36.8 C) (!) 97.5 F (36.4 C) 97.9 F (36.6 C)  TempSrc: Oral Oral Oral Oral  SpO2: 100% 100% 100% 98%  Weight:      Height:        Intake/Output Summary (Last 24 hours) at 03/11/2018 1328 Last data filed at 03/11/2018 0600 Gross per 24 hour  Intake 1635.89 ml  Output 500 ml  Net 1135.89 ml   Filed Weights   03/07/18 1846 03/08/18 1518 03/09/18 0428  Weight: 50 kg 45.7 kg 44.5 kg    Examination:  General exam: Alert, NAD Respiratory system: CTA Cardiovascular system: S 1, S 2 RRR Gastrointestinal system: BS present, soft ,nt Central nervous system: alert, moves  arms, less rigidity upper  extremities, per family she is talking more but confuse, confabulation. Extremities: Lower extremities with rigidity  Skin: No rashes, lesions or ulcers    Data Reviewed: I have personally reviewed following labs and imaging studies  CBC: Recent Labs  Lab 03/07/18 1852 03/08/18 1526 03/09/18 0440 03/10/18 0841  WBC 9.0 9.2 7.6 10.3  NEUTROABS 6.6  --   --   --   HGB 10.8* 10.7* 9.7* 10.6*  HCT 33.8* 33.4* 30.4* 32.6*  MCV 100.6* 99.4 99.7 97.6  PLT 193 190 180 186   Basic Metabolic Panel: Recent Labs  Lab 03/07/18 1852 03/08/18 1526 03/09/18 0440 03/10/18 0841 03/11/18 0355  NA 149* 151* 144 138 137  K 3.9 4.0 3.3* 3.5 4.8  CL 116* 120* 113* 105 109  CO2 22 24 23 26  20*  GLUCOSE 151* 111* 139* 138* 102*  BUN 37* 31* 31* 22 22  CREATININE 1.25* 1.06* 1.11* 0.89 0.96  CALCIUM 11.6* 11.1* 10.5* 10.5* 10.4*   GFR: Estimated Creatinine Clearance: 25.4 mL/min (by C-G formula based on SCr of 0.96 mg/dL). Liver Function Tests: Recent Labs  Lab 03/07/18 1852 03/09/18 0440  AST 24 28  ALT 21 19  ALKPHOS 45 41  BILITOT 0.9 1.4*  PROT 7.2 5.8*  ALBUMIN 4.1 3.3*   No results for input(s): LIPASE, AMYLASE in the last 168 hours. Recent Labs  Lab 03/07/18 1852  AMMONIA 15   Coagulation Profile: No results for input(s): INR, PROTIME in the last 168 hours. Cardiac Enzymes: Recent Labs  Lab 03/07/18 1852  TROPONINI 0.03*   BNP (last 3 results) No results for input(s): PROBNP in the last 8760 hours. HbA1C: No results for input(s): HGBA1C in the last 72 hours. CBG: Recent Labs  Lab 03/08/18 1349 03/08/18 1529  GLUCAP 99 96   Lipid Profile: No results for input(s): CHOL, HDL, LDLCALC, TRIG, CHOLHDL, LDLDIRECT in the last 72 hours. Thyroid Function Tests: Recent Labs    03/09/18 0440  TSH 0.092*  FREET4 1.64  T3FREE 2.0   Anemia Panel: Recent Labs    03/09/18 0440  VITAMINB12 968*  FOLATE 31.6  FERRITIN 156  TIBC 228*  IRON 72  RETICCTPCT 1.2    Sepsis Labs: No results for input(s): PROCALCITON, LATICACIDVEN in the last 168 hours.  Recent Results (from the past 240 hour(s))  Urine culture     Status: None   Collection Time: 03/07/18  6:52 PM  Result Value Ref Range Status   Specimen Description   Final    URINE, CATHETERIZED Performed at [facility] [place], [address] [place], [place] [address]    Special Requests   Final    Normal Performed at [facility] [place], [address] [place], [place] [address]    Culture   Final    NO GROWTH Performed at [facility], [address] [place], [place] [address]    Report Status 03/09/2018 FINAL  Final  Urine culture     Status: Abnormal   Collection Time: 03/08/18  8:07 AM  Result Value Ref Range Status   Specimen Description   Final    URINE,  CATHETERIZED Performed at [facility], [address] [place], [place] [address]    Special Requests   Final    NONE Performed at [facility], [address] [place], [place] [address]    Culture 20,000 COLONIES/mL ESCHERICHIA COLI (A)  Final   Report Status 03/10/2018 FINAL  Final   Organism ID, Bacteria ESCHERICHIA COLI (A)  Final      Susceptibility   Escherichia coli - MIC*    AMPICILLIN >=32 RESISTANT Resistant     CEFAZOLIN <=4 SENSITIVE Sensitive     CEFTRIAXONE <=1 SENSITIVE Sensitive     CIPROFLOXACIN >=4 RESISTANT Resistant     GENTAMICIN <=1 SENSITIVE Sensitive     IMIPENEM <=0.25 SENSITIVE Sensitive     NITROFURANTOIN <=16 SENSITIVE Sensitive     TRIMETH/SULFA <=20 SENSITIVE Sensitive     AMPICILLIN/SULBACTAM 16 INTERMEDIATE Intermediate     PIP/TAZO <=4 SENSITIVE Sensitive     Extended ESBL NEGATIVE Sensitive     * 20,000 COLONIES/mL ESCHERICHIA COLI         Radiology Studies: No results found.      Scheduled Meds: . carbidopa-levodopa  1 tablet Oral TID   And  . entacapone  200 mg Oral TID  . enoxaparin (LOVENOX) injection  30 mg Subcutaneous  Q24H  . senna-docusate  1 tablet Oral BID   Continuous Infusions: . dextrose 75 mL/hr at 03/11/18 0600  . fluconazole (DIFLUCAN) IV Stopped (03/10/18 1727)     LOS: 2 days    Time spent: 35 minutes.     Alba Cory, MD [facility] Pager [phone]  If 7PM-7AM, please contact night-coverage [email or web address] Password [email or web address] 03/11/2018, 1:28 PM

## 2018-03-11 NOTE — Evaluation (Signed)
Physical Therapy Evaluation Patient Details Name: [name] MRN: [id number] DOB: 02/18/1930 Today's Date: 03/11/2018   History of Present Illness  84 year old past medical history for stroke, diabetes, Parkinson disease who at baseline is able to speak a few words. Patient over the last 4 days has not been able to eat, she has been more lethargic.Patient was admitted with acute metabolic encephalopathy. MRI of brain was negative for acute stroke. She was found to have hypernatremia, and hypercalcemia.   Clinical Impression  Pt's daughter was present. She reported the patient has not been able to walk and has been total care the last few weeks. Pt was able to open her eyes when cued from her daughter. Pt presents with the above diagnosis. Pt is currently lethargic, demonstrates increased tone and rigidity and is total care with mobility. Family is requesting PT to try another session to see if she participates more. Started education on LE positioning to decrease contractures and frequent bed position changes. Will place patient on a trial of PT to see if she is able to actively participate in therapy to decrease burden of care. Family is deciding between SNF or [facility].     Follow Up Recommendations SNF    Equipment Recommendations  None recommended by PT    Recommendations for Other Services       Precautions / Restrictions Precautions Precautions: Fall Restrictions Weight Bearing Restrictions: No      Mobility  Bed Mobility Overal bed mobility: Needs Assistance Bed Mobility: Supine to Sit     Supine to sit: +2 for physical assistance;HOB elevated     General bed mobility comments: Pt unable to actively assist. Pt maintained her arms crossed over her chest. Pt was able to open her eyes briefly when her daughter cued her.  Transfers                    Ambulation/Gait                Stairs            Wheelchair Mobility    Modified  Rankin (Stroke Patients Only)       Balance Overall balance assessment: Needs assistance Sitting-balance support: No upper extremity supported Sitting balance-Leahy Scale: Zero                                       Pertinent Vitals/Pain Pain Assessment: Faces Faces Pain Scale: Hurts little more Pain Location: unclear- pain noticed with LE ROM with L hip flexion. Pain Intervention(s): Repositioned    Home Living Family/patient expects to be discharged to:: Private residence Living Arrangements: Children Available Help at Discharge: Family Type of Home: House         Home Equipment: Wheelchair - [employer] Corporationmanual;Walker - 2 wheels Additional Comments: family will decide which home she will d/c too.    Prior Function Level of Independence: Needs assistance   Gait / Transfers Assistance Needed: last few months a gradual decline. She was ambulating with RW. now family is carring her and she is total care     Comments: Family reported they will consider SNF, but would rather have her d/c home if they can get assistance with her care.     Hand Dominance        Extremity/Trunk Assessment        Lower Extremity Assessment Lower  Extremity Assessment: Difficult to assess due to impaired cognition       Communication   Communication: Prefers language other than [place];Interpreter utilized(daughter translated)  Cognition Arousal/Alertness: Lethargic Behavior During Therapy: Flat affect Overall Cognitive Status: Difficult to assess                                        General Comments General comments (skin integrity, edema, etc.): Pt unable to participate at this time. Pt maintained a fetal position in the bed. Noticed rigidity in bilateral LE left greater than right. Pt was unable to actively move LE on command. discussed bed positioning to decrease risk of LE contractures. Family reports they are doining position changes. Family is requesting  PT to see patient another time to see if her participation level improves. Family report she was more alert yesterday.    Exercises     Assessment/Plan    PT Assessment Patient needs continued PT services(trial of PT)  PT Problem List Decreased strength;Decreased mobility;Decreased safety awareness;Impaired tone;Decreased range of motion;Decreased coordination;Decreased knowledge of precautions;Decreased activity tolerance;Decreased cognition;Decreased balance;Pain       PT Treatment Interventions DME instruction;Therapeutic activities;Therapeutic exercise;Patient/family education;Balance training    PT Goals (Current goals can be found in the Care Plan section)  Acute Rehab PT Goals Patient Stated Goal: To see if she can participate with therapy and improve mobility PT Goal Formulation: With family Time For Goal Achievement: 03/18/18 Potential to Achieve Goals: Poor    Frequency Min 2X/week   Barriers to discharge        Co-evaluation               AM-PAC PT "6 Clicks" Mobility  Outcome Measure Help needed turning from your back to your side while in a flat bed without using bedrails?: Total Help needed moving from lying on your back to sitting on the side of a flat bed without using bedrails?: Total Help needed moving to and from a bed to a chair (including a wheelchair)?: Total Help needed standing up from a chair using your arms (e.g., wheelchair or bedside chair)?: Total Help needed to walk in hospital room?: Total Help needed climbing 3-5 steps with a railing? : Total 6 Click Score: 6    End of Session   Activity Tolerance: Patient limited by lethargy Patient left: in bed;with call bell/phone within reach;with family/visitor present Nurse Communication: Need for lift equipment PT Visit Diagnosis: Other abnormalities of gait and mobility (R26.89);Muscle weakness (generalized) (M62.81)    Time: 1610-9604 PT Time Calculation (min) (ACUTE ONLY): 22  min   Charges:   PT Evaluation $PT Eval Moderate Complexity: 1 Mod          [address]  Lilyan [place] 03/11/2018, 11:59 AM

## 2018-03-12 DIAGNOSIS — R400 Somnolence: Secondary | ICD-10-CM

## 2018-03-12 LAB — BASIC METABOLIC PANEL
ANION GAP: 6 (ref 5–15)
BUN: 23 mg/dL (ref 8–23)
CALCIUM: 10.1 mg/dL (ref 8.9–10.3)
CO2: 24 mmol/L (ref 22–32)
Chloride: 106 mmol/L (ref 98–111)
Creatinine, Ser: 1.08 mg/dL — ABNORMAL HIGH (ref 0.44–1.00)
GFR, EST AFRICAN AMERICAN: 52 mL/min — AB (ref >60)
GFR, EST NON AFRICAN AMERICAN: 45 mL/min — AB (ref >60)
GLUCOSE: 132 mg/dL — AB (ref 70–99)
Potassium: 4.2 mmol/L (ref 3.5–5.1)
SODIUM: 136 mmol/L (ref 135–145)

## 2018-03-12 NOTE — Progress Notes (Signed)
PROGRESS NOTE    [name] GowerQuy T Burns  GNF:[id number] DOB: 12/17/1929 DOA: 03/07/2018 PCP: [name], [name] L, NP  Brief Narrative:84 year old past medical history for stroke, diabetes, Parkinson disease who at baseline is able to speak a few words. Patient over the last 4 days has not been able to eat, she has been more lethargic.  Patient was admitted with acute metabolic encephalopathy. MRI of brain was negative for acute stroke. She was found to have hypernatremia, and hypercalcemia.   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  -continue with IV fluids, D 5.  -MRI negative for stroke -B 12 normal, TSH no, free T4 normal. Ammonia normal.  -ABG Negative for hypercapnia.  -no evidence of infection, UA clean, chest x ray negative.  -More alert, per family patient is talking more but confuse, confabulation. Will monitor for now.  -Speech evaluation. Pass swallow.  Per son patient ate more yesterday.  She is sleepy today, lethargic. Didn't sleep well last night, problem with impaction.   2-Hypertension;  Hold oral home BP medication. Diuretics/.  IV Hydralazine PRN ordered.  MRI negative for stroke.  Improved.   3-Hypernatremia; from poor oral intake. IV fluid, D 5. Improved.   4-Parkin's diseases;  Continue with Sinemet, entacapne  5-Constipation;  KUB negative for obstruction. Started  Senna, miralax.  Was impacted last night. disimpacted by nurse. Marland Kitchen.   6-Anemia; probably anemia of chronic disease B12 968, folate 31,ferritin 156,[place] 72.  Hypokalemia; replete oral.   Bradycardia; replete electrolytes, give mg.  Asymptomatic.   RN  Pressure Injury Documentation:    Malnutrition Type:  Nutrition Problem: Moderate Malnutrition Etiology: acute illness(dysphagia)   Malnutrition Characteristics:  Signs/Symptoms: energy intake < 75% for > 7 days, percent weight loss, mild muscle depletion, moderate muscle depletion   Nutrition Interventions:  Interventions: Ensure Enlive (each supplement provides 350kcal and 20 grams of protein), Refer to RD note for recommendations  Estimated body mass index is 22.8 kg/m as calculated from the following:   Height as of this encounter: 4\' 7"  (1.397 m).   Weight as of this encounter: 44.5 kg.   DVT prophylaxis: Lovenox Code Status: . Full Code.  Family Communication: Multiples family member at bedside. Explain to them that [name] diseases, put her at high risk for dehydration, recurrent admissions, offer to meet with palliative to talk about goals of care, diseases trajectory, options.  Disposition Plan: Remain in the hospital, poor oral intake, palliative care meeting. Lethargic today   Consultants:   none   Procedures:   none   Antimicrobials:   None   Subjective: She is sleepy today, didn't sleep last night. Problems with fecal impaction.   Objective: Vitals:   03/11/18 1516 03/11/18 1829 03/11/18 2129 03/12/18 0545  BP: (!) 163/60 (!) 148/76 (!) 141/52 129/82  Pulse: 70 82 (!) 57 66  Resp: 20  18 20   Temp: 98.2 F (36.8 C)  97.8 F (36.6 C) 97.9 F (36.6 C)  TempSrc: Oral  Oral Oral  SpO2: 97%  98% 96%  Weight:      Height:        Intake/Output Summary (Last 24 hours) at 03/12/2018 1426 Last data filed at 03/12/2018 0200 Gross per 24  hour  Intake 1119.93 ml  Output 1130 ml  Net -10.07 ml   Filed Weights   03/07/18 1846 03/08/18 1518 03/09/18 0428  Weight: 50 kg 45.7 kg 44.5 kg    Examination:  General exam: sleepy  Respiratory system: CTA Cardiovascular system: S 1, S 2 RRR Gastrointestinal system: BS present, soft,  Central  nervous system: sleepy.  Extremities: no edema, rigidity  Skin: no rashes    Data Reviewed: I have personally reviewed following labs and imaging studies  CBC: Recent Labs  Lab 03/07/18 1852 03/08/18 1526 03/09/18 0440 03/10/18 0841  WBC 9.0 9.2 7.6 10.3  NEUTROABS 6.6  --   --   --   HGB 10.8* 10.7* 9.7* 10.6*  HCT 33.8* 33.4* 30.4* 32.6*  MCV 100.6* 99.4 99.7 97.6  PLT 193 190 180 186   Basic Metabolic Panel: Recent Labs  Lab 03/08/18 1526 03/09/18 0440 03/10/18 0841 03/11/18 0355 03/12/18 1137  NA 151* 144 138 137 136  K 4.0 3.3* 3.5 4.8 4.2  CL 120* 113* 105 109 106  CO2 24 23 26  20* 24  GLUCOSE 111* 139* 138* 102* 132*  BUN 31* 31* 22 22 23   CREATININE 1.06* 1.11* 0.89 0.96 1.08*  CALCIUM 11.1* 10.5* 10.5* 10.4* 10.1   GFR: Estimated Creatinine Clearance: 22.5 mL/min (A) (by C-G formula based on SCr of 1.08 mg/dL (H)). Liver Function Tests: Recent Labs  Lab 03/07/18 1852 03/09/18 0440  AST 24 28  ALT 21 19  ALKPHOS 45 41  BILITOT 0.9 1.4*  PROT 7.2 5.8*  ALBUMIN 4.1 3.3*   No results for input(s): LIPASE, AMYLASE in the last 168 hours. Recent Labs  Lab 03/07/18 1852  AMMONIA 15   Coagulation Profile: No results for input(s): INR, PROTIME in the last 168 hours. Cardiac Enzymes: Recent Labs  Lab 03/07/18 1852  TROPONINI 0.03*   BNP (last 3 results) No results for input(s): PROBNP in the last 8760 hours. HbA1C: No results for input(s): HGBA1C in the last 72 hours. CBG: Recent Labs  Lab 03/08/18 1349 03/08/18 1529  GLUCAP 99 96   Lipid Profile: No results for input(s): CHOL, HDL, LDLCALC, TRIG, CHOLHDL, LDLDIRECT in the last 72 hours. Thyroid Function Tests: No results for input(s): TSH, T4TOTAL, FREET4, T3FREE, THYROIDAB in the last 72 hours. Anemia Panel: No results for input(s): VITAMINB12, FOLATE, FERRITIN, TIBC, IRON, RETICCTPCT in the last 72 hours. Sepsis Labs: No results for input(s): PROCALCITON, LATICACIDVEN in the last  168 hours.  Recent Results (from the past 240 hour(s))  Urine culture     Status: None   Collection Time: 03/07/18  6:52 PM  Result Value Ref Range Status   Specimen Description   Final    URINE, CATHETERIZED Performed at [facility], [address] [place], [place] [address]    Special Requests   Final    Normal Performed at [facility], [address] [place], [place] [address]    Culture   Final    NO GROWTH Performed at [facility], [address], [place], [place] [address]    Report Status 03/09/2018 FINAL  Final  Urine culture     Status: Abnormal   Collection Time: 03/08/18  8:07 AM  Result Value Ref Range Status   Specimen Description   Final    URINE, CATHETERIZED Performed at [facility], [address] [place], [place] [address]    Special Requests   Final  NONE Performed at [facility], [address] [place] [address] [place], [place] [address]    Culture 20,000 COLONIES/mL ESCHERICHIA COLI (A)  Final   Report Status 03/10/2018 FINAL  Final   Organism ID, Bacteria ESCHERICHIA COLI (A)  Final      Susceptibility   Escherichia coli - MIC*    AMPICILLIN >=32 RESISTANT Resistant     CEFAZOLIN <=4 SENSITIVE Sensitive     CEFTRIAXONE <=1 SENSITIVE Sensitive     CIPROFLOXACIN >=4 RESISTANT Resistant     GENTAMICIN <=1 SENSITIVE Sensitive     IMIPENEM <=0.25 SENSITIVE Sensitive     NITROFURANTOIN <=16 SENSITIVE Sensitive     TRIMETH/SULFA <=20 SENSITIVE Sensitive     AMPICILLIN/SULBACTAM 16 INTERMEDIATE Intermediate     PIP/TAZO <=4 SENSITIVE Sensitive     Extended ESBL NEGATIVE Sensitive     * 20,000 COLONIES/mL ESCHERICHIA COLI         Radiology Studies: No results found.      Scheduled Meds: . carbidopa-levodopa  1 tablet Oral TID   And  . entacapone  200 mg Oral TID  . enoxaparin (LOVENOX) injection  30 mg Subcutaneous Q24H  . polyethylene glycol  17 g Oral Daily  . senna-docusate  1 tablet Oral BID    Continuous Infusions: . dextrose 75 mL/hr at 03/12/18 0537  . fluconazole (DIFLUCAN) IV 100 mg (03/12/18 1353)     LOS: 3 days    Time spent: 35 minutes.     Alba Cory, MD [facility] Pager [phone]  If 7PM-7AM, please contact night-coverage [email or web address] Password [email or web address] 03/12/2018, 2:26 PM

## 2018-03-13 ENCOUNTER — Other Ambulatory Visit: Payer: Self-pay

## 2018-03-13 DIAGNOSIS — Z515 Encounter for palliative care: Secondary | ICD-10-CM

## 2018-03-13 DIAGNOSIS — R1314 Dysphagia, pharyngoesophageal phase: Secondary | ICD-10-CM

## 2018-03-13 DIAGNOSIS — F0280 Dementia in other diseases classified elsewhere without behavioral disturbance: Secondary | ICD-10-CM

## 2018-03-13 DIAGNOSIS — Z7189 Other specified counseling: Secondary | ICD-10-CM

## 2018-03-13 DIAGNOSIS — G20 Parkinson's disease: Secondary | ICD-10-CM

## 2018-03-13 LAB — BASIC METABOLIC PANEL
ANION GAP: 8 (ref 5–15)
BUN: 25 mg/dL — ABNORMAL HIGH (ref 8–23)
CO2: 21 mmol/L — ABNORMAL LOW (ref 22–32)
Calcium: 9.9 mg/dL (ref 8.9–10.3)
Chloride: 105 mmol/L (ref 98–111)
Creatinine, Ser: 1.2 mg/dL — ABNORMAL HIGH (ref 0.44–1.00)
GFR calc Af Amer: 46 mL/min — ABNORMAL LOW (ref >60)
GFR, EST NON AFRICAN AMERICAN: 40 mL/min — AB (ref >60)
GLUCOSE: 133 mg/dL — AB (ref 70–99)
Potassium: 3.9 mmol/L (ref 3.5–5.1)
Sodium: 134 mmol/L — ABNORMAL LOW (ref 135–145)

## 2018-03-13 LAB — MAGNESIUM
Magnesium: 1.7 mg/dL (ref 1.7–2.4)
Magnesium: 2.8 mg/dL — ABNORMAL HIGH (ref 1.7–2.4)

## 2018-03-13 LAB — PHOSPHORUS: Phosphorus: 3 mg/dL (ref 2.5–4.6)

## 2018-03-13 MED ORDER — SODIUM CHLORIDE 0.9 % IV SOLN
INTRAVENOUS | Status: DC
  Administered 2018-03-13 – 2018-03-14 (×2): via INTRAVENOUS

## 2018-03-13 MED ORDER — MAGNESIUM SULFATE 2 GM/50ML IV SOLN
2.0000 g | Freq: Once | INTRAVENOUS | Status: AC
  Administered 2018-03-13: 2 g via INTRAVENOUS
  Filled 2018-03-13: qty 50

## 2018-03-13 NOTE — Progress Notes (Signed)
    Durable Medical Equipment  (From admission, onward)         Start     Ordered   03/13/18 1138  For home use only DME Hospital bed  Once    Question Answer Comment  The above medical condition requires: Patient requires the ability to reposition frequently   Head must be elevated greater than: 30 degrees   Bed type Semi-electric   Support Surface: Gel Overlay      03/13/18 1137

## 2018-03-13 NOTE — Care Management Note (Signed)
Case Management Note  Patient Details  Name: [name] MRN: [id number] Date of Birth: 01/13/1930  Subjective/Objective:                  Discharge planning  Action/Plan[facility]- [facility], pt, ot, speech, aide and sw [facility] per the son. Alfonse FlavorsKaren Byrd notified for both dme and hhc  Expected Discharge Date:  (unknown)               Expected Discharge Plan:  Home w Home Health Services  In-House Referral:     Discharge planning Services  CM Consult  Post Acute Care Choice:  Durable Medical Equipment, Home Health Choice offered to:  Adult Children  DME Arranged:  Hospital bed DME Agency:  [facility]  HH Arranged:  RN, PT, OT, Nurse's Aide, Social Work, Facilities managerpeech Therapy HH Agency:  [facility]  Status of Service:  Completed, signed off  If discussed at [employer] Meetings, dates discussed:    Additional Comments:  Golda AcreDavis, Naje Rice Lynn, RN 03/13/2018, 2:54 PM

## 2018-03-13 NOTE — Care Management Important Message (Signed)
Important Message  Patient Details  Name: [name] MRN: [id number] Date of Birth: 01/07/1930   Medicare Important Message Given:  Yes    Caren MacadamFuller, Anaih Brander 03/13/2018, 1:11 PMImportant Message  Patient Details  Name: [name] MRN: [id number] Date of Birth: 08/14/1929   Medicare Important Message Given:  Yes    Caren MacadamFuller, Marjean Imperato 03/13/2018, 1:11 PM

## 2018-03-13 NOTE — Progress Notes (Signed)
Patient's heart rate sustaining in the 40s, patient in the room sleeping but able to awake and follows family's commend but still drowsy, vitals 116/50, 41, 18, 98%-RA. Dr. Sunnie Nielsenegalado notified, EKG ordered and reviewing patient's med. Update given to son at the bedside. Will continue to assess patient.

## 2018-03-13 NOTE — Progress Notes (Signed)
PROGRESS NOTE    [name]  WUJ:[id number] DOB: 11/03/1929 DOA: 03/07/2018 PCP: Iona HansenJones, Penny L, NP  Brief Narrative:84 year old past medical history for stroke, diabetes, Parkinson disease who at baseline is able to speak a few words. Patient over the last 4 days has not been able to eat, she has been more lethargic.  Patient was admitted with acute metabolic encephalopathy. MRI of brain was negative for acute stroke. She was found to have hypernatremia, and hypercalcemia.   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  -continue with IV fluids, D 5.  -MRI negative for stroke -B 12 normal, TSH no, free T4 normal. Ammonia normal.  -ABG Negative for hypercapnia.  -no evidence of infection, UA clean, chest x ray negative.  -More alert, per family patient is talking more but confuse, confabulation. Will monitor for now.  -Speech evaluation. Pass swallow.  -Patient appears to have delirium, she was very lethargic yesterday.  Patient was not able to sleep the night before.  Per family patient became alert around 5 PM yesterday.  Patient is again is sleepy this morning. I suspect patient has hypoactive delirium.  Versus progression of her dementia with Parkinson disease. Waiting for palliative care evaluation.  2-Hypertension;  Hold oral home BP medication. Diuretics/.  IV Hydralazine PRN ordered.  MRI negative for stroke.  Improved.   3-Hypernatremia; from poor oral intake. IV fluid, D 5. Improved.   4-Parkin's diseases;  Continue with Sinemet, entacapne  5-Constipation;  KUB negative for obstruction. Started  Senna, miralax.  Was  disimpacted.   6-Anemia; probably anemia of chronic disease B12 968, folate 31,ferritin 156,[place] 72.  7-Hypokalemia; resolved.   Bradycardia; replete electrolytes.  Asymptomatic, bradycardia while sleeping.  Check phosphorus and mg level.  When patient wake up her heart rate goes up to the 50 range.  Hyponatremia; change fluids to normal saline.  RN Pressure Injury Documentation:    Malnutrition Type:  Nutrition Problem: Moderate Malnutrition Etiology: acute illness(dysphagia)   Malnutrition Characteristics:  Signs/Symptoms: energy intake < 75% for > 7 days, percent weight loss, mild muscle depletion, moderate muscle depletion   Nutrition Interventions:  Interventions: Ensure Enlive (each supplement provides 350kcal and 20 grams of protein), Refer to RD note for recommendations  Estimated body mass index is 22.8 kg/m as calculated from the following:   Height as of this encounter: 4\' 7"  (1.397 m).   Weight as of this encounter: 44.5 kg.   DVT prophylaxis: Lovenox Code Status: . Full Code.  Family Communication: Multiples family member at bedside. Explain to them that [name] diseases, put her at high risk for dehydration, recurrent admissions, offer to meet with palliative to talk about goals of care, diseases trajectory, options.  Disposition Plan: Remain in the hospital, poor oral intake, palliative care meeting. Lethargic today   Consultants:   none   Procedures:   none   Antimicrobials:   None   Subjective: She is lethargic. At the end of my  evaluation she open her eyes, goes back to sleep.  Objective: Vitals:   03/12/18 2045 03/13/18 0012 03/13/18 0656 03/13/18 1244  BP: 135/70 (!) 109/55 (!) 159/52 (!) 116/50  Pulse: 71 (!) 49 (!) 50 (!) 41  Resp: 14 18 20    Temp: 99.5 F (37.5 C) 98 F (36.7 C) 98 F (36.7 C) 98 F (36.7 C)  TempSrc: Oral Oral Oral Oral  SpO2: 100% 100% 98% 98%  Weight:      Height:        Intake/Output  Summary (Last 24 hours) at 03/13/2018 1413 Last data filed at 03/13/2018 1300 Gross per 24 hour  Intake 2375.71 ml  Output 1450 ml  Net 925.71 ml   Filed Weights   03/07/18 1846 03/08/18 1518 03/09/18 0428  Weight: 50 kg 45.7 kg 44.5 kg    Examination:  General exam: Sleepy Respiratory system: Crackles at the bases Cardiovascular system: S1-S2 regular Gastrointestinal system: Sounds present soft nontender Central nervous system: Sleepy Extremities: Lower extremity rigidity no edema Skin: no rashes    Data Reviewed: I have personally reviewed following labs and imaging studies  CBC: Recent Labs  Lab 03/07/18 1852 03/08/18 1526 03/09/18 0440 03/10/18 0841  WBC 9.0 9.2 7.6 10.3  NEUTROABS 6.6  --   --   --   HGB 10.8* 10.7* 9.7* 10.6*  HCT 33.8* 33.4* 30.4* 32.6*  MCV 100.6* 99.4 99.7 97.6  PLT 193 190 180 186   Basic Metabolic Panel: Recent Labs  Lab 03/09/18 0440 03/10/18 0841 03/11/18 0355 03/12/18 1137 03/13/18 0042  NA 144 138 137 136 134*  K 3.3* 3.5 4.8 4.2 3.9  CL 113* 105 109 106 105  CO2 23 26 20* 24 21*  GLUCOSE 139* 138* 102* 132* 133*  BUN 31* 22 22 23  25*  CREATININE 1.11* 0.89 0.96 1.08* 1.20*  CALCIUM 10.5* 10.5* 10.4* 10.1 9.9  MG  --   --   --   --  1.7   GFR: Estimated Creatinine Clearance: 20.3 mL/min (A) (by C-G formula based on SCr of 1.2 mg/dL (H)). Liver Function Tests: Recent Labs  Lab 03/07/18 1852 03/09/18 0440  AST 24 28  ALT 21 19  ALKPHOS 45 41  BILITOT 0.9 1.4*  PROT 7.2 5.8*  ALBUMIN 4.1 3.3*   No results for input(s): LIPASE, AMYLASE in the last 168 hours. Recent Labs  Lab 03/07/18 1852  AMMONIA 15   Coagulation Profile: No results for input(s): INR, PROTIME in the last 168 hours. Cardiac Enzymes: Recent Labs  Lab 03/07/18 1852  TROPONINI 0.03*   BNP (last 3 results) No results for input(s): PROBNP in the last 8760 hours. HbA1C: No results for input(s): HGBA1C in the last 72 hours. CBG: Recent  Labs  Lab 03/08/18 1349 03/08/18 1529  GLUCAP 99 96   Lipid Profile: No results for input(s): CHOL, HDL, LDLCALC, TRIG, CHOLHDL, LDLDIRECT in the last 72 hours. Thyroid Function Tests: No results for input(s): TSH, T4TOTAL, FREET4, T3FREE, THYROIDAB in the last 72 hours. Anemia Panel: No results for input(s): VITAMINB12, FOLATE, FERRITIN, TIBC, IRON, RETICCTPCT in the last 72 hours. Sepsis Labs: No results for input(s): PROCALCITON, LATICACIDVEN in the last 168 hours.  Recent Results (from the past 240 hour(s))  Urine culture     Status: None   Collection Time: 03/07/18  6:52 PM  Result Value Ref Range Status   Specimen Description   Final    URINE, CATHETERIZED Performed at [facility], [address] [place], [place] [address]    Special Requests   Final    Normal Performed at [facility], [address] [place], [place] [address]    Culture   Final  NO GROWTH Performed at [facility], [address] [place], [place] [address]    Report Status 03/09/2018 FINAL  Final  Urine culture     Status: Abnormal   Collection Time: 03/08/18  8:07 AM  Result Value Ref Range Status   Specimen Description   Final    URINE, CATHETERIZED Performed at [facility], [address] [place], [place] [address]    Special Requests   Final    NONE Performed at [facility], [address] [place], [place] [address]    Culture 20,000 COLONIES/mL ESCHERICHIA COLI (A)  Final   Report Status 03/10/2018 FINAL  Final   Organism ID, Bacteria ESCHERICHIA COLI (A)  Final      Susceptibility   Escherichia coli - MIC*    AMPICILLIN >=32 RESISTANT Resistant     CEFAZOLIN <=4 SENSITIVE Sensitive     CEFTRIAXONE <=1 SENSITIVE Sensitive     CIPROFLOXACIN >=4 RESISTANT Resistant     GENTAMICIN <=1 SENSITIVE Sensitive     IMIPENEM <=0.25 SENSITIVE Sensitive     NITROFURANTOIN <=16 SENSITIVE Sensitive     TRIMETH/SULFA <=20 SENSITIVE  Sensitive     AMPICILLIN/SULBACTAM 16 INTERMEDIATE Intermediate     PIP/TAZO <=4 SENSITIVE Sensitive     Extended ESBL NEGATIVE Sensitive     * 20,000 COLONIES/mL ESCHERICHIA COLI         Radiology Studies: No results found.      Scheduled Meds: . carbidopa-levodopa  1 tablet Oral TID   And  . entacapone  200 mg Oral TID  . enoxaparin (LOVENOX) injection  30 mg Subcutaneous Q24H  . polyethylene glycol  17 g Oral Daily  . senna-docusate  1 tablet Oral BID   Continuous Infusions: . sodium chloride 75 mL/hr at 03/13/18 1342     LOS: 4 days    Time spent: 35 minutes.     Alba Cory, MD [facility] Pager [phone]  If 7PM-7AM, please contact night-coverage [email or web address] Password [email or web address] 03/13/2018, 2:13 PM

## 2018-03-14 LAB — BASIC METABOLIC PANEL
Anion gap: 6 (ref 5–15)
BUN: 26 mg/dL — AB (ref 8–23)
CHLORIDE: 111 mmol/L (ref 98–111)
CO2: 23 mmol/L (ref 22–32)
CREATININE: 1.05 mg/dL — AB (ref 0.44–1.00)
Calcium: 10 mg/dL (ref 8.9–10.3)
GFR calc Af Amer: 54 mL/min — ABNORMAL LOW (ref >60)
GFR calc non Af Amer: 47 mL/min — ABNORMAL LOW (ref >60)
GLUCOSE: 87 mg/dL (ref 70–99)
Potassium: 4.2 mmol/L (ref 3.5–5.1)
SODIUM: 140 mmol/L (ref 135–145)

## 2018-03-14 MED ORDER — BISACODYL 10 MG RE SUPP: 10.0000 mg | Freq: Every day | RECTAL | 0 refills | Status: AC | PRN

## 2018-03-14 MED ORDER — HYDRALAZINE HCL 25 MG PO TABS
25.0000 mg | ORAL_TABLET | Freq: Two times a day (BID) | ORAL | Status: DC
  Administered 2018-03-14 (×2): 25 mg via ORAL
  Filled 2018-03-14 (×2): qty 1

## 2018-03-14 MED ORDER — HYDRALAZINE HCL 25 MG PO TABS: 25.0000 mg | ORAL_TABLET | Freq: Two times a day (BID) | ORAL | 0 refills | Status: AC

## 2018-03-14 MED ORDER — SENNOSIDES-DOCUSATE SODIUM 8.6-50 MG PO TABS: 1.0000 | ORAL_TABLET | Freq: Two times a day (BID) | ORAL | 0 refills | Status: AC

## 2018-03-14 MED ORDER — POLYETHYLENE GLYCOL 3350 17 G PO PACK: 17.0000 g | PACK | Freq: Every day | ORAL | 0 refills | Status: AC

## 2018-03-14 NOTE — Discharge Summary (Signed)
Physician Discharge Summary  [name] [id number] DOB: [birth year] DOA: 82/19/2019  PCP: Berkley Harvey, NP  Admit date: 03/07/2018 Discharge date: 03/14/2018  Admitted From: Home Disposition: Discharged home with hospice  Recommendations for Outpatient Follow-up:  1. Follow up with PCP in 1-2 weeks 2. Please obtain BMP/CBC in one week    Discharge Condition: Stable CODE STATUS: DNR Diet recommendation: Dysphagia   Brief/Interim Summary: Brief Narrative:84 year old past medical history for stroke, diabetes, Parkinson disease who at baseline is able to speak a few words. Patient over the last 4 days has not been able to eat, she has been more lethargic.  Patient was admitted with acute metabolic encephalopathy. MRI of brain was negative for acute stroke. She was found to have hypernatremia, and hypercalcemia.   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  -continue with IV fluids, D 5.  -MRI negative for stroke -B 12 normal, TSH no, free T4 normal. Ammonia normal.  -ABG Negative for hypercapnia.  -no evidence of infection, UA clean, chest x ray negative.  -More alert, per family patient is talking more but confuse, confabulation. Will monitor for now.  -Speech evaluation. Pass swallow.  -Patient appears to have delirium, she was very lethargic yesterday.  Patient was not able to sleep the night before.  Per family patient became alert around 5 PM yesterday.  Patient is again is sleepy this morning. I suspect patient has hypoactive delirium.  Versus progression of her dementia with Parkinson disease. Patient mental status fluctuate.   She has poor oral intake.  Family met with palliative care and plan is to go home with hospice.   2-Hypertension;  Hold oral home BP medication. Diuretics/.  IV Hydralazine PRN ordered.  MRI negative for stroke.  Discharged on hydralazine 25 twice daily.  Stop lisinopril to avoid further dehydration.  3-Hypernatremia;from poor oral intake. IV fluid, D 5. Improved.   4-Parkin's diseases;  Continue with Sinemet, entacapne  5-Constipation; KUB negative for obstruction. Started  Senna, miralax.  Was disimpacted.  Having bowel movement.  6-Anemia; probably anemia of chronic disease B12 968, folate 31,ferritin 156,[place] 72.  7-Hypokalemia; resolved.   Bradycardia; replete electrolytes.  Asymptomatic, bradycardia while sleeping.  Check phosphorus and mg level.  When patient wake up her heart rate goes up to the 50 range.  Hyponatremia; change fluids to normal saline.  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   bisacodyl 10 MG suppository Commonly known as:  DULCOLAX Place 1 suppository (10 mg total) rectally  daily as needed for moderate constipation.   carbidopa-levodopa-entacapone 25-100-200 MG tablet Commonly known as:  STALEVO Take 1 tablet by mouth 3 (three) times daily.   hydrALAZINE 25 MG tablet Commonly  known as:  APRESOLINE Take 1 tablet (25 mg total) by mouth 2 (two) times daily.   multivitamin tablet Take 1 tablet by mouth daily.   polyethylene glycol packet Commonly known as:  MIRALAX / GLYCOLAX Take 17 g by mouth daily. Start taking on:  03/15/2018   senna-docusate 8.6-50 MG tablet Commonly known as:  Senokot-S Take 1 tablet by mouth 2 (two) times daily.            Durable Medical Equipment  (From admission, onward)         Start     Ordered   03/13/18 1138  For home use only DME Hospital bed  Once    Question Answer Comment  The above medical condition requires: Patient requires the ability to reposition frequently   Head must be elevated greater than: 30 degrees   Bed type Semi-electric   Support Surface: Gel Overlay      03/13/18 1137          No Known Allergies  Consultations:  Palliative   Procedures/Studies: Dg Chest 2 View  Result Date: 03/07/2018 CLINICAL DATA:  Acute mental status change. EXAM: CHEST - 2 VIEW COMPARISON:  July 18, 2007 FINDINGS: Compression fracture midthoracic vertebral body, age indeterminate but new since 2009. The heart, hila, mediastinum, lungs, and pleura are otherwise unremarkable. IMPRESSION: Compression fracture of a midthoracic vertebral body, new since 2009 but otherwise age indeterminate. Recommend clinical correlation. No other acute abnormalities. Electronically Signed   By: Dorise Bullion III M.D   On: 03/07/2018 20:57   Dg Abd 1 View  Result Date: 03/08/2018 CLINICAL DATA:  Constipation EXAM: ABDOMEN - 1 VIEW COMPARISON:  None. FINDINGS: Moderate amount of stool throughout the colon. There is no bowel dilatation to suggest obstruction. There is no evidence of pneumoperitoneum, portal venous gas or pneumatosis. There are no pathologic calcifications along the expected course of the ureters. Abdominal aortic atherosclerosis. The osseous structures are unremarkable. IMPRESSION: Moderate amount of stool throughout  the colon. Electronically Signed   By: Kathreen Devoid   On: 03/08/2018 19:56   Ct Head Wo Contrast  Result Date: 03/07/2018 CLINICAL DATA:  Progressing altered mental status over the last week. EXAM: CT HEAD WITHOUT CONTRAST TECHNIQUE: Contiguous axial images were obtained from the base of the skull through the vertex without intravenous contrast. COMPARISON:  05/23/2017 FINDINGS: Brain: No evidence of acute infarction, hemorrhage, hydrocephalus, extra-axial collection or mass lesion/mass effect. Mild cerebral atrophy. Vascular: Moderate intracranial arterial vascular calcifications. Skull: Calvarium appears intact. Sinuses/Orbits: Paranasal sinuses and mastoid air cells are clear. Other: None. IMPRESSION: No acute intracranial abnormality.  Mild cerebral atrophy. Electronically Signed   By: Lucienne Capers M.D.   On: 03/07/2018 21:53   Mr Brain Wo Contrast  Result Date: 03/08/2018 CLINICAL DATA:  Lethargy, anorexia for 4 days. History of Parkinson's disease, hypertension. EXAM: MRI HEAD WITHOUT CONTRAST TECHNIQUE: Multiplanar, multiecho pulse sequences of the brain and surrounding structures were obtained without intravenous contrast. COMPARISON:  CT HEAD March 07, 2018 FINDINGS: Moderately motion degraded examination. INTRACRANIAL CONTENTS: No reduced diffusion to suggest acute ischemia. No susceptibility artifact to suggest hemorrhage. The ventricles and sulci are normal for patient's age. Patchy supratentorial white matter FLAIR T2 hyperintensities. Old RIGHT cerebellar pontine small infarcts. Hazy T2 hyperintense signal within the basal ganglia  and thalami associated with chronic small vessel ischemic changes. No suspicious parenchymal signal, masses, mass effect. No abnormal extra-axial fluid collections. No extra-axial masses. VASCULAR: Normal major intracranial vascular flow voids present at skull base. SKULL AND UPPER CERVICAL SPINE: No abnormal sellar expansion. No suspicious calvarial bone  marrow signal. Craniocervical junction maintained. SINUSES/ORBITS: Mild paranasal sinus mucosal thickening. Mastoid air cells are well aerated.The included ocular globes and orbital contents are non-suspicious. OTHER: None. IMPRESSION: 1. Moderately motion degraded examination. No acute intracranial process. 2. 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 3. Mild-to-moderate chronic small vessel ischemic changes. Electronically Signed   By: Elon Alas M.D.   On: 03/08/2018 19:30      Subjective: Sleepy, open eyes  Discharge Exam: Vitals:   03/14/18 0506 03/14/18 0800  BP: (!) 132/46 (!) 190/76  Pulse: (!) 53 64  Resp: 18 16  Temp: 97.7 F (36.5 C) 98 F (36.7 C)  SpO2: 99% 99%   Vitals:   03/13/18 1244 03/13/18 2112 03/14/18 0506 03/14/18 0800  BP: (!) 116/50 (!) 141/53 (!) 132/46 (!) 190/76  Pulse: (!) 41 66 (!) 53 64  Resp:  _0 Temp: 98 F (36.7 C) 97.6 F (36.4 C) 97.7 F (36.5 C) 98 F (36.7 C)  TempSrc: Oral Oral Oral Oral  SpO2: 98% 100% 99% 99%  Weight:      Height:        General: sleepy, open eyes.  Cardiovascular: RRR, S1/S2 +, no rubs, no gallops Respiratory: CTA bilaterally, no wheezing, no rhonchi Abdominal: Soft, NT, ND, bowel sounds + Extremities: no edema, no cyanosis.,  Lying down in bed in fetal position    The results of significant diagnostics from this hospitalization (including imaging, microbiology, ancillary and laboratory) are listed below for reference.     Microbiology: Recent Results (from the past 240 hour(s))  Urine culture     Status: None   Collection Time: 03/07/18  6:52 PM  Result Value Ref Range Status   Specimen Description   Final    URINE, CATHETERIZED Performed at [facility], [address] [place], [place] [address]    Special Requests   Final    Normal Performed at [facility], [address] [place], [place] [address]    Culture   Final    NO GROWTH Performed at [facility], [place] [address] [place], [place] [address]    Report Status 03/09/2018 FINAL  Final  Urine culture     Status: Abnormal   Collection Time: 03/08/18  8:07 AM  Result Value Ref Range Status   Specimen Description   Final    URINE, CATHETERIZED Performed at [facility], [place], [place] [address]    Special Requests   Final    NONE Performed at [facility], [address] [place], [place] [address]    Culture 20,000 COLONIES/mL ESCHERICHIA COLI (A)  Final   Report Status 03/10/2018 FINAL  Final   Organism ID, Bacteria ESCHERICHIA COLI (A)  Final      Susceptibility   Escherichia coli - MIC*    AMPICILLIN >=32 RESISTANT Resistant     CEFAZOLIN <=4 SENSITIVE Sensitive     CEFTRIAXONE <=1 SENSITIVE Sensitive     CIPROFLOXACIN >=4 RESISTANT Resistant     GENTAMICIN <=1 SENSITIVE Sensitive     IMIPENEM <=0.25 SENSITIVE Sensitive     NITROFURANTOIN <=16 SENSITIVE Sensitive  TRIMETH/SULFA <=20 SENSITIVE Sensitive     AMPICILLIN/SULBACTAM 16 INTERMEDIATE Intermediate     PIP/TAZO <=4 SENSITIVE Sensitive     Extended ESBL NEGATIVE Sensitive     * 20,000 COLONIES/mL ESCHERICHIA COLI     Labs: BNP (last 3 results) No results for input(s): BNP in the last 8760 hours. Basic Metabolic Panel: Recent Labs  Lab 03/10/18 0841 03/11/18 0355 03/12/18 1137 03/13/18 0042 03/13/18 1439 03/14/18 0413  NA 138 137 136 134*  --  140  K 3.5 4.8 4.2 3.9  --  4.2  CL 105 109 106 105  --  111  CO2 26 20* 24 21*  --  23  GLUCOSE 138* 102* 132* 133*  --  87  BUN _0 25*  --  26*  CREATININE 0.89 0.96 1.08* 1.20*  --  1.05*  CALCIUM 10.5* 10.4* 10.1 9.9  --  10.0  MG  --   --   --  1.7 2.8*  --   PHOS  --   --   --   --  3.0  --    Liver Function Tests: Recent Labs  Lab 03/07/18 1852 03/09/18 0440  AST 24 28  ALT 21 19  ALKPHOS 45 41  BILITOT 0.9 1.4*  PROT 7.2 5.8*  ALBUMIN 4.1 3.3*   No results for input(s): LIPASE,  AMYLASE in the last 168 hours. Recent Labs  Lab 03/07/18 1852  AMMONIA 15   CBC: Recent Labs  Lab 03/07/18 1852 03/08/18 1526 03/09/18 0440 03/10/18 0841  WBC 9.0 9.2 7.6 10.3  NEUTROABS 6.6  --   --   --   HGB 10.8* 10.7* 9.7* 10.6*  HCT 33.8* 33.4* 30.4* 32.6*  MCV 100.6* 99.4 99.7 97.6  PLT 193 190 180 186   Cardiac Enzymes: Recent Labs  Lab 03/07/18 1852  TROPONINI 0.03*   BNP: Invalid input(s): POCBNP CBG: Recent Labs  Lab 03/08/18 1349 03/08/18 1529  GLUCAP 99 96   D-Dimer No results for input(s): DDIMER in the last 72 hours. Hgb A1c No results for input(s): HGBA1C in the last 72 hours. Lipid Profile No results for input(s): CHOL, HDL, LDLCALC, TRIG, CHOLHDL, LDLDIRECT in the last 72 hours. Thyroid function studies No results for input(s): TSH, T4TOTAL, T3FREE, THYROIDAB in the last 72 hours.  Invalid input(s): FREET3 Anemia work up No results for input(s): VITAMINB12, FOLATE, FERRITIN, TIBC, IRON, RETICCTPCT in the last 72 hours. Urinalysis    Component Value Date/Time   COLORURINE YELLOW 03/08/2018 [place] 03/08/2018 0807   LABSPEC 1.020 03/08/2018 0807   PHURINE 6.0 03/08/2018 0807   GLUCOSEU NEGATIVE 03/08/2018 0807   HGBUR NEGATIVE 03/08/2018 0807   BILIRUBINUR NEGATIVE 03/08/2018 0807   KETONESUR 15 (A) 03/08/2018 0807   PROTEINUR NEGATIVE 03/08/2018 0807   NITRITE NEGATIVE 03/08/2018 0807   LEUKOCYTESUR NEGATIVE 03/08/2018 0807   Sepsis Labs Invalid input(s): PROCALCITONIN,  WBC,  LACTICIDVEN Microbiology Recent Results (from the past 240 hour(s))  Urine culture     Status: None   Collection Time: 03/07/18  6:52 PM  Result Value Ref Range Status   Specimen Description   Final    URINE, CATHETERIZED Performed at [facility], [address] [place], [place] [address]    Special Requests   Final    Normal Performed at [facility], [address] [place], [place] [address]    Culture    Final    NO GROWTH Performed  at [facility], [place] [address] [place], [place] [address]    Report Status 03/09/2018 FINAL  Final  Urine culture     Status: Abnormal   Collection Time: 03/08/18  8:07 AM  Result Value Ref Range Status   Specimen Description   Final    URINE, CATHETERIZED Performed at [facility], [place],  [address]    Special Requests   Final    NONE Performed at [facility], [address] [place], [place] [address]    Culture 20,000 COLONIES/mL ESCHERICHIA COLI (A)  Final   Report Status 03/10/2018 FINAL  Final   Organism ID, Bacteria ESCHERICHIA COLI (A)  Final      Susceptibility   Escherichia coli - MIC*    AMPICILLIN >=32 RESISTANT Resistant     CEFAZOLIN <=4 SENSITIVE Sensitive     CEFTRIAXONE <=1 SENSITIVE Sensitive     CIPROFLOXACIN >=4 RESISTANT Resistant     GENTAMICIN <=1 SENSITIVE Sensitive     IMIPENEM <=0.25 SENSITIVE Sensitive     NITROFURANTOIN <=16 SENSITIVE Sensitive     TRIMETH/SULFA <=20 SENSITIVE Sensitive     AMPICILLIN/SULBACTAM 16 INTERMEDIATE Intermediate     PIP/TAZO <=4 SENSITIVE Sensitive     Extended ESBL NEGATIVE Sensitive     * 20,000 COLONIES/mL ESCHERICHIA COLI     Time coordinating discharge: 35 minutes.   SIGNED:   Elmarie Shiley, MD  [facility] 03/14/2018, 11:21 AM Pager   If 7PM-7AM, please contact night-coverage [email or web address] Password [email or web address]

## 2018-03-14 NOTE — Progress Notes (Signed)
[facility])  Hospice eligibility has been confirmed.    Thank you, Wallis BambergJennifer Woody BSN, RN [facility] Hospital Liaison (listed in [facility]) [phone]

## 2018-03-14 NOTE — Progress Notes (Signed)
PMT progress note  Met with the patient and her son at the bedside, he remembers meeting my colleague Dr Domingo Cocking on 03-13-18.   BP (!) 190/76 (BP Location: Right Arm) Comment: Notified RN, took manual BP correlated, MD notified  Pulse 64   Temp 98 F (36.7 C) (Oral)   Resp 16   Ht 4' 7"  (1.397 m)   Wt 44 kg   SpO2 99%   BMI 22.55 kg/m  Patient is resting in bed, she awakens easily, she is not in distress Regular S 1 S 2  Abdomen is not distended, non tender No edema Thin frail lady appears with generalized weakness Is bed bound  Labs and imaging noted.   Patient lives at home with family in [place]. She has 11 children, most of them live locally and coordinate her care giving.   Discussed with patient's son about hospice philosophy of care, how home with hospice services differ from home with home health services.   Discussed with patient's son about decline trajectory from CVD, parkinson's perspective.   All of his questions answered to the best of my ability, some of his remaining questions are about how much help hospice can actually provide for them at home, how the medical equipment is going to be delivered.   PPS 30%  25 minutes spent Loistine Chance MD [facility] health palliative medicine team [phone]

## 2018-03-14 NOTE — Consult Note (Signed)
Consultation Note Date: 03/14/2018   Patient Name: [name]  DOB: August 17, 1929  MRN: [id number]  Age / Sex: 84 y.o., female  PCP: Berkley Harvey, NP Referring Physician: Elmarie Shiley, MD  Reason for Consultation: Establishing goals of care  HPI/Patient Profile: 84 y.o. female  with past medical history of CVD, DM, Parkinsons, stroke admitted on 03/07/2018 with increased lethargy.   Clinical Assessment and Goals of Care: I met today with patient's son. We discussed clinical course as well as wishes moving forward in regard to advanced directives.  Concepts specific to code status and rehospitalization discussed.  We discussed difference between a aggressive medical intervention path and a palliative, comfort focused care path.  Values and goals of care important to patient and family were attempted to be elicited.  Her son reports that she has 11 children who share in caring for her.  They understand that she is approaching end of life, and goal is to ensure quality for her remaining time.  They want for her to be at home.  Concept of Hospice and Palliative Care were discussed.  Son had been declining hospice as he though that it was someplace she had to go to live.  We discussed options for care including home health vs home with hospice support.  Based on stated goals (be home, comfortable, cared for by family, and die at home), I recommended son to consider electing her hospice benefits.  We discussed that in light of multiple chronic medical problems that have worsened with this acute problem, care should be focused on interventions that are likely to allow the patient to achieve goal of getting back to home and spending time with family. I discussed with family regarding heroic interventions at the end-of-life and they agree this would not be in line with prior expressed wishes for a natural death or  be likely to lead to getting well enough to go back home. They were in agreement with changing CODE STATUS to DO NOT RESUSCITATE.  Questions and concerns addressed.   PMT will continue to support holistically.   SUMMARY OF RECOMMENDATIONS   - DNR/DNI - Family is going to discuss option for home with hospice.  They were under impression that electing hospice meant that she had to go live at facility.  Plan for f/u tomorrow as family may want to meet to discuss with home hospice liaison.     Code Status/Advance Care Planning:  DNR  Palliative Prophylaxis:   Bowel Regimen and Delirium Protocol  Additional Recommendations (Limitations, Scope, Preferences):  Avoid Hospitalization  Psycho-social/Spiritual:   Desire for further Chaplaincy support:no  Additional Recommendations: Education on Hospice  Prognosis:   < 4 weeks  Discharge Planning:Home with hospice vs Home with Home Health      Primary Diagnoses: Present on Admission: . Acute encephalopathy . Dysphagia, pharyngoesophageal phase   I have reviewed the medical record, interviewed the patient and family, and examined the patient. The following aspects are pertinent.  Past Medical History:  Diagnosis Date  .  Anxiety   . Cerebrovascular disease   . Depression   . Diabetes mellitus without complication ([place])   . Dysphagia, pharyngoesophageal phase 08/12/2014   Dysphagia for liquids  . Gait disorder   . Hypertension   . Meralgia paresthetica    Bilateral  . Movement disorder    Parkinson's disease  . REM sleep behavior disorder   . Restless leg syndrome    Social History   Socioeconomic History  . Marital status: Divorced    Spouse name: Not on file  . Number of children: 11  . Years of education: 4 th  . Highest education level: Not on file  Occupational History  . Occupation: N/A    Comment: retired  Scientific laboratory technician  . Financial resource strain: Not on file  . Food insecurity:    Worry: Not on file     Inability: Not on file  . Transportation needs:    Medical: Not on file    Non-medical: Not on file  Tobacco Use  . Smoking status: Never Smoker  . Smokeless tobacco: Never Used  Substance and Sexual Activity  . Alcohol use: No    Alcohol/week: 0.0 standard drinks  . Drug use: No  . Sexual activity: Not on file  Lifestyle  . Physical activity:    Days per week: Not on file    Minutes per session: Not on file  . Stress: Not on file  Relationships  . Social connections:    Talks on phone: Not on file    Gets together: Not on file    Attends religious service: Not on file    Active member of club or organization: Not on file    Attends meetings of clubs or organizations: Not on file    Relationship status: Not on file  Other Topics Concern  . Not on file  Social History Narrative   Patient is widowed . Patient lives with her daughter Lucio Edward) [phone].   Retired.   Education 4 th grade.   Right handed.   Caffeine - 3-4 cups of tea daily   History reviewed. No pertinent family history. Scheduled Meds: . carbidopa-levodopa  1 tablet Oral TID   And  . entacapone  200 mg Oral TID  . enoxaparin (LOVENOX) injection  30 mg Subcutaneous Q24H  . polyethylene glycol  17 g Oral Daily  . senna-docusate  1 tablet Oral BID   Continuous Infusions: . sodium chloride 75 mL/hr at 03/13/18 1845   PRN Meds:.bisacodyl, hydrALAZINE, ondansetron **OR** ondansetron (ZOFRAN) IV Medications Prior to Admission:  Prior to Admission medications   Medication Sig Start Date End Date Taking? Authorizing Provider  acetaminophen (TYLENOL) 500 MG tablet Take 1,000 mg by mouth every 6 (six) hours as needed for mild pain.   Yes [provider]  carbidopa-levodopa-entacapone (STALEVO 100) 25-100-200 MG tablet Take 1 tablet by mouth 3 (three) times daily. 02/07/18  Yes Kathrynn Ducking, MD  lisinopril (PRINIVIL,ZESTRIL) 2.5 MG tablet Take 2.5 mg by mouth daily.   Yes [provider]  Multiple Vitamin (MULTIVITAMIN) tablet Take 1 tablet by mouth daily.   Yes [provider]   No Known Allergies Review of Systems Unable to obtain  Physical Exam General: Somnolent, frail HEENT: No bruits, no goiter, no JVD Heart: Regular rate and rhythm. No murmur appreciated. Lungs: Fair air movement, scattered rhonchi Abdomen: Soft, nontender, nondistended, positive bowel sounds.  Ext: No significant edema Skin: Warm and dry   Vital Signs:  BP (!) 141/53 (BP Location: Left Arm)   Pulse 66   Temp 97.6 F (36.4 C) (Oral)   Resp 16   Ht _0  (1.397 m)   Wt 44.5 kg   SpO2 100%   BMI 22.80 kg/m  Pain Scale: PAINAD   Pain Score: Asleep   SpO2: SpO2: 100 % O2 Device:SpO2: 100 % O2 Flow Rate: .   IO: Intake/output summary:   Intake/Output Summary (Last 24 hours) at 03/14/2018 0026 Last data filed at 03/13/2018 1845 Gross per 24 hour  Intake 1547.39 ml  Output 850 ml  Net 697.39 ml    LBM: Last BM Date: 03/13/18 Baseline Weight: Weight: 50 kg Most recent weight: Weight: 44.5 kg     Palliative Assessment/Data:   Flowsheet Rows     Most Recent Value  Intake Tab  Referral Department  Hospitalist  Unit at Time of Referral  -- [urology/telementry]  Palliative Care Primary Diagnosis  Neurology  Date Notified  03/11/18  Palliative Care Type  New Palliative care  Reason for referral  Clarify Goals of Care  Date of Admission  03/07/18  # of days IP prior to Palliative referral  4  Clinical Assessment  Psychosocial & Spiritual Assessment  Palliative Care Outcomes      Time Total: 60 min Greater than 50%  of this time was spent counseling and coordinating care related to the above assessment and plan.  Signed by: Micheline Rough, MD   Please contact Palliative Medicine Team phone at [phone] for questions and concerns.  For individual provider: See Shea Evans

## 2018-03-14 NOTE — Progress Notes (Signed)
OT Cancellation Note  Patient Details Name: [name] MRN: [id number] DOB: 11/04/1929   Cancelled Treatment:    Reason Eval/Treat Not Completed: Other (comment)  Spoke with RN. Pt total care and going home with hospice this day. [name], [name]  [name], OT Acute Rehabilitation Services [phone] Office- [phone]    03/14/2018, 11:09 AM

## 2018-03-14 NOTE — Care Management Note (Signed)
Case Management Note  Patient Details  Name: [name] MRN: [id number] Date of Birth: 11/24/1929  Subjective/Objective:CM referral for home hospice choice per attending, & palliative care team. Spoke to son in rm(patient defers to son) [name] differences with HHC vs Home hospice services-Son chose Home w/hospice-chose [facility] liason [name] aware of referral-pcp-[name],dme needed:hospital bed,gel mattress,overbed table,hoyer lift. [address] stay[address] [place] [phone]. Hospital bed to be delivered to home prior patient's d/c. [facility] for ambulance transp-DNR form in shadow chart for attending to sign-attending aware. No further CM needs.                   Action/Plan:d/c home w/hospice/dme-hospital bed must get to home prior d/c.   Expected Discharge Date:  03/14/18               Expected Discharge Plan:  Home w Hospice Care  In-House Referral:     Discharge planning Services  CM Consult  Post Acute Care Choice:  Durable Medical Equipment, Home Health Choice offered to:  Adult Children  DME Arranged:    DME Agency:     HH Arranged:    HH Agency:  [facility]  Status of Service:  Completed, signed off  If discussed at Long Length of Stay Meetings, dates discussed:    Additional Comments:  [name], [name] Kaser, RN 03/14/2018, 12:23 PM

## 2018-03-14 NOTE — Progress Notes (Addendum)
[facility] ([facility])    Notified by Servando SalinaKathy, RNCM of family [facility] after discharge.  Chart and patient [facility] and eligibility is pending at this time.     Spoke with Oda CoganHoang (son), confirmed interest, answered questions and provided information regarding hospice services and philosophy.      Plan is to discharge later today after needed DME has been delivered via [facility].     Please send signed and completed DNR home with the patient.   Patient will need prescriptions for discharge comfort medications (if necessary).   DME needs discussed, patient currently has the following equipment in the home:  Transport chair Family needs:  Bed with rails, bedside table and hoyer lift.  [facility] equipment specialist has been notified and will contact [facility] to arrange delivery to the home.  Home address has been verified and is:  [address], [place] [phone].  Oda CoganHoang (son) is the family contact to arrange time of equipment delivery.     [facility] Referral Center is aware of the above information.  Completed discharge summary should be faxed to [phone] once eligibility confirmed and completed.     Please notify [facility] at [phone] (if after 5 call [phone]) when the pt is ready to leave the unit.    [facility] information [facility] to [facility].   Above information shared with [facility] RNCM.   Thank you for this referral Wallis BambergJennifer Woody BSN, RN [facility] Hospital Liaison (listed in [facility]) [phone]

## 2018-05-20 DEATH — deceased

## 2019-11-02 IMAGING — MR MR HEAD W/O CM
8 of 10 series · 36 of 48 positions shown · non-contrast
Comparison: CT HEAD March 07, 2018

CLINICAL DATA: Lethargy, anorexia for 4 days. History of
Parkinson's disease, hypertension.

EXAM:
MRI HEAD WITHOUT CONTRAST
TECHNIQUE: Multiplanar, multiecho pulse sequences of the brain and surrounding
structures were obtained without intravenous contrast.

[Series 4: T1 · sagittal · 5.0mm · 0.47mm/px · 2 of 22 slices shown]
[im 1/22]
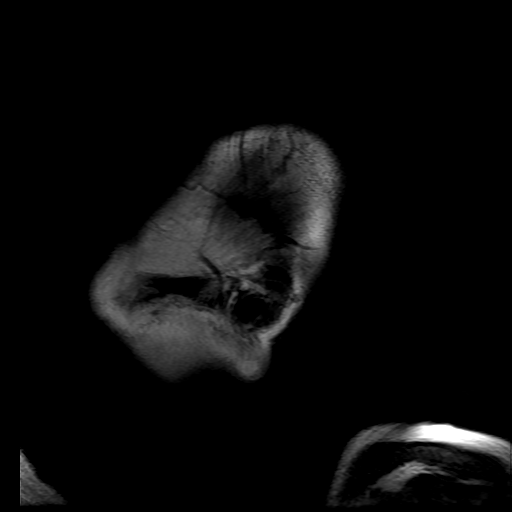
[im 11/22]
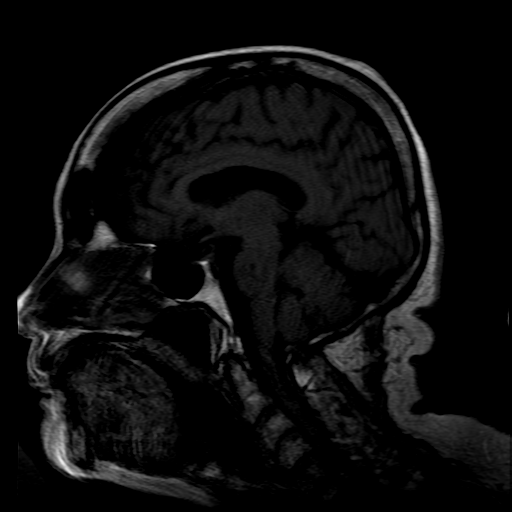

[Series 5: DWI · axial · 4.0mm · 1.17mm/px · z∈[+19,+168]mm · 9 of 72 slices shown (1 of 4)]
[im 1/72]
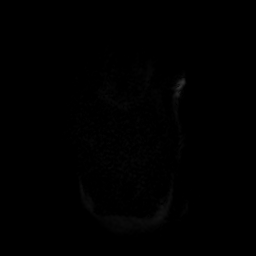
[im 9/72]
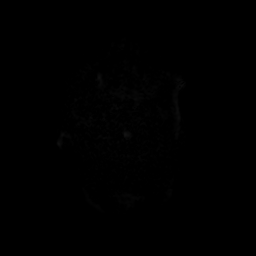
[im 18/72]
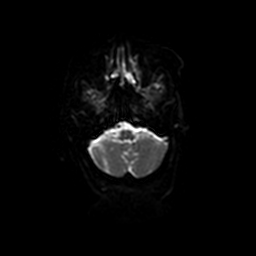
[im 27/72]
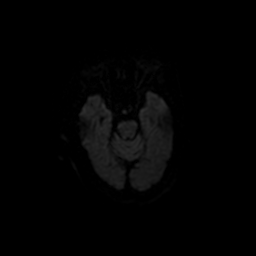
[im 36/72]
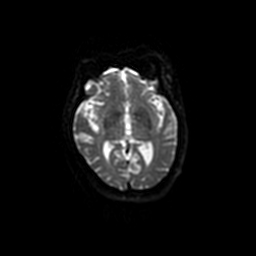
[im 45/72]
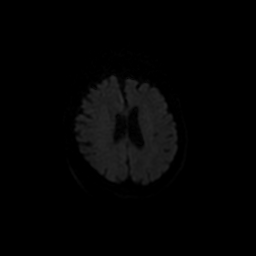
[im 54/72]
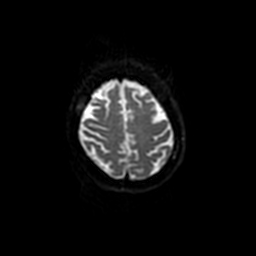
[im 63/72]
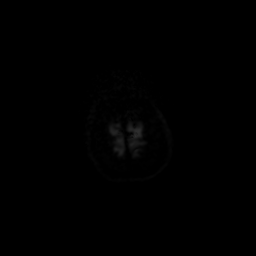
[im 72/72]
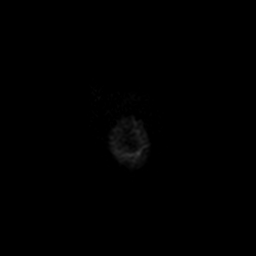

[Series 6: DWI · coronal · 4.0mm · 1.09mm/px · 8 of 64 slices shown (2 of 4)]
[im 1/64]
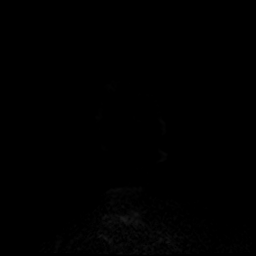
[im 10/64]
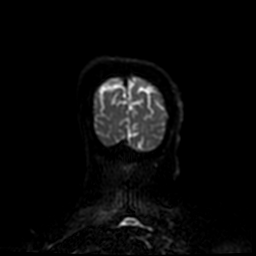
[im 19/64]
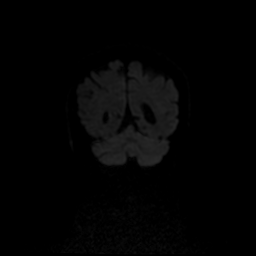
[im 28/64]
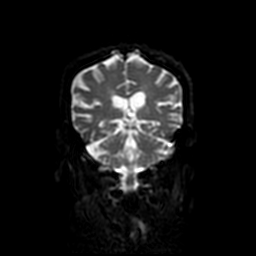
[im 37/64]
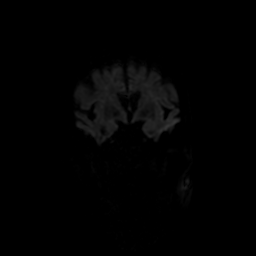
[im 46/64]
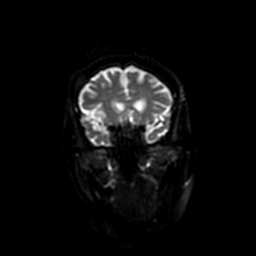
[im 55/64]
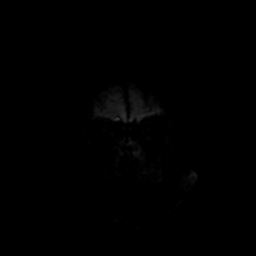
[im 64/64]
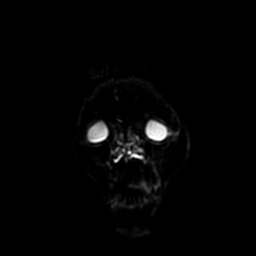

[Series 7: T2 · axial · 5.0mm · 0.43mm/px · z∈[+14,+150]mm · 3 of 25 slices shown (1 of 2)]
[im 1/25]
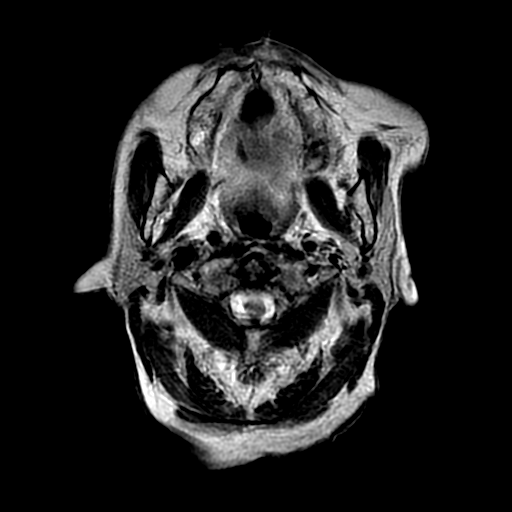
[im 13/25]
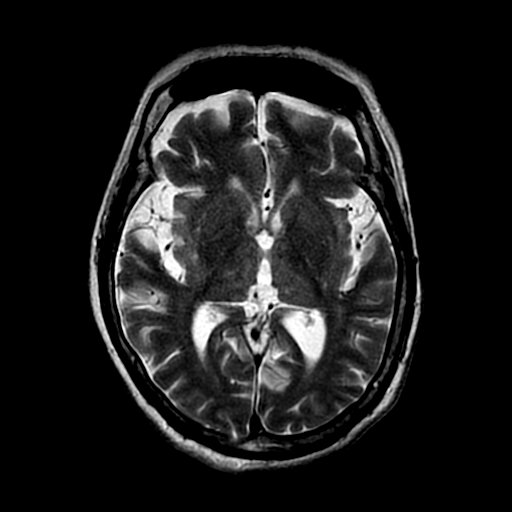
[im 25/25]
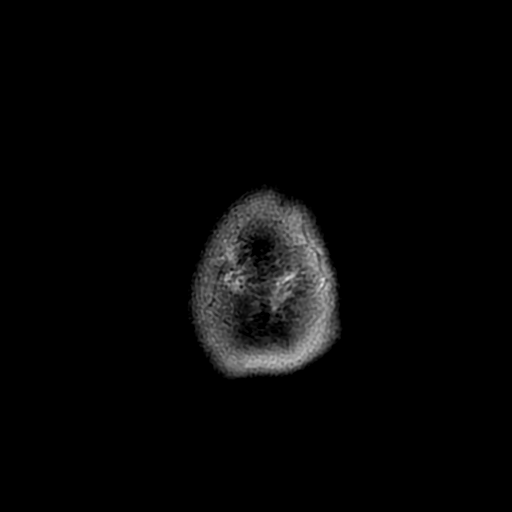

[Series 8: FLAIR · axial · 5.0mm · 0.43mm/px · z∈[+14,+150]mm · 3 of 25 slices shown]
[im 1/25]
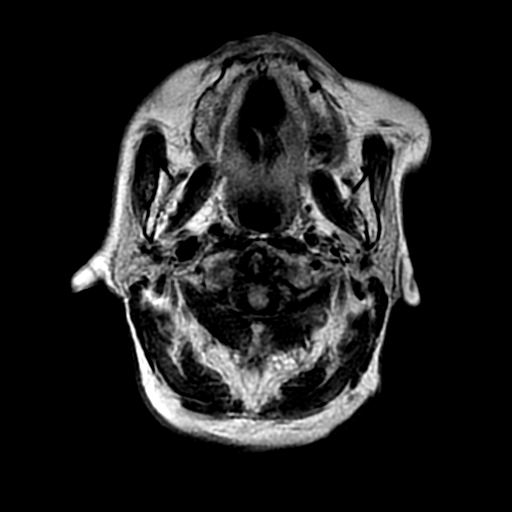
[im 13/25]
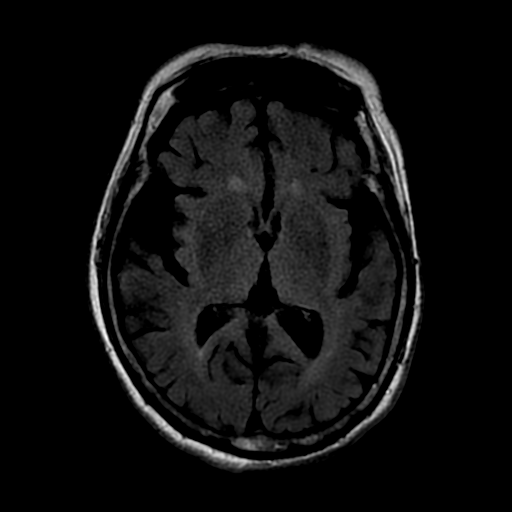
[im 25/25]
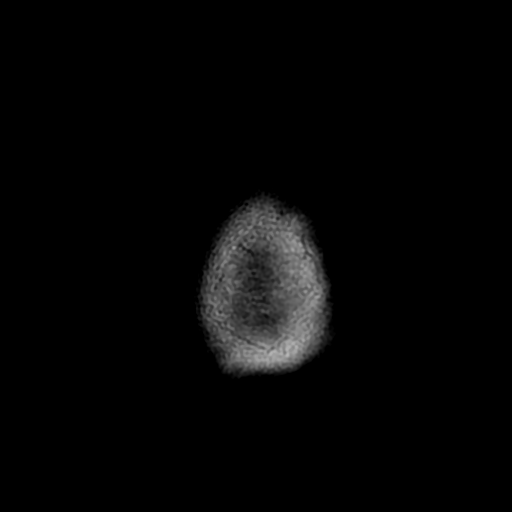

[Series 11: T2 · coronal · 5.0mm · 0.45mm/px · 3 of 25 slices shown (2 of 2)]
[im 1/25]
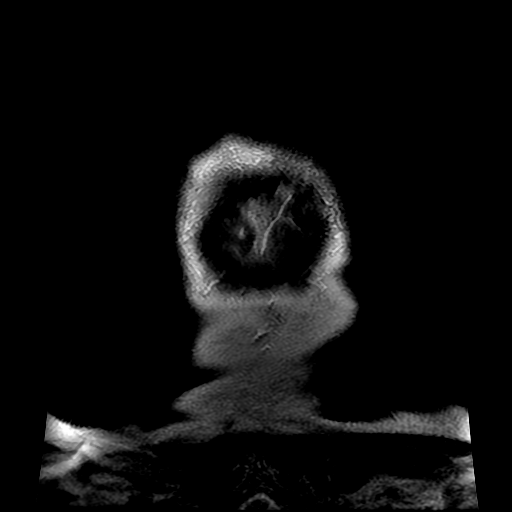
[im 13/25]
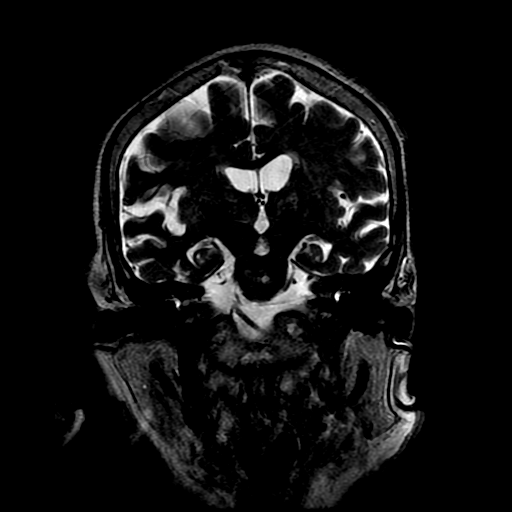
[im 25/25]
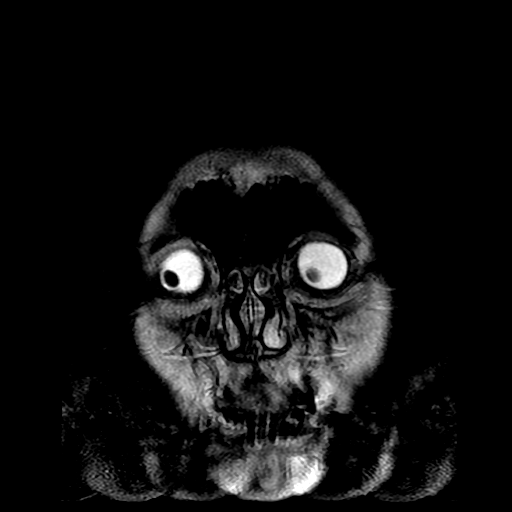

[Series 500: DWI · axial · 4.0mm · 1.17mm/px · z∈[+19,+168]mm · 4 of 36 slices shown (3 of 4)]
[im 1/36]
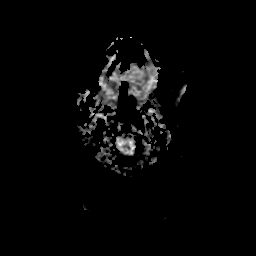
[im 12/36]
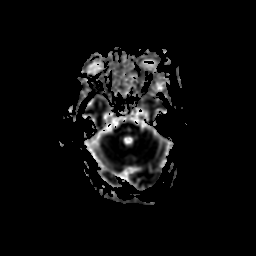
[im 24/36]
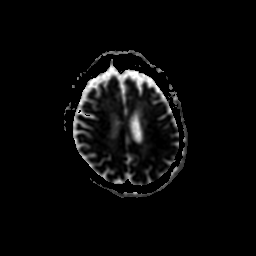
[im 36/36]
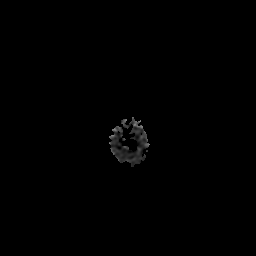

[Series 600: DWI · coronal · 4.0mm · 1.09mm/px · 4 of 32 slices shown (4 of 4)]
[im 1/32]
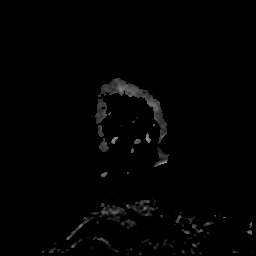
[im 11/32]
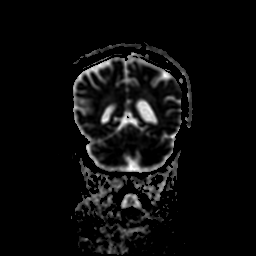
[im 21/32]
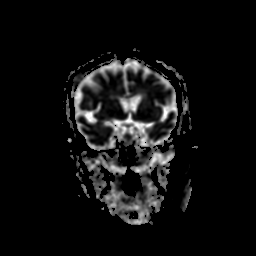
[im 32/32]
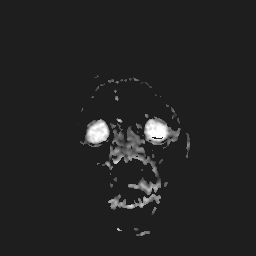

[36 of 48 positions shown; findings below may reference images not displayed]

FINDINGS: Moderately motion degraded examination.

INTRACRANIAL CONTENTS: No reduced diffusion to suggest acute
ischemia. No susceptibility artifact to suggest hemorrhage. The
ventricles and sulci are normal for patient's age. Patchy
supratentorial white matter FLAIR T2 hyperintensities. Old RIGHT
cerebellar pontine small infarcts. Hazy T2 hyperintense signal
within the basal ganglia and thalami associated with chronic small
vessel ischemic changes. No suspicious parenchymal signal, masses,
mass effect. No abnormal extra-axial fluid collections. No
extra-axial masses.

VASCULAR: Normal major intracranial vascular flow voids present at
skull base.

SKULL AND UPPER CERVICAL SPINE: No abnormal sellar expansion. No
suspicious calvarial bone marrow signal. Craniocervical junction
maintained.

SINUSES/ORBITS: Mild paranasal sinus mucosal thickening. Mastoid air
cells are well aerated.The included ocular globes and orbital
contents are non-suspicious.

OTHER: None.
IMPRESSION: 1. Moderately motion degraded examination. No acute intracranial
process.
2. Old pontine and RIGHT cerebellar small infarcts.
3. Mild-to-moderate chronic small vessel ischemic changes.
